# Patient Record
Sex: Female | Born: 1975 | Race: White | Hispanic: No | Marital: Married | Smoking: Former smoker
Health system: Southern US, Community
[De-identification: ages and names within clinical notes are randomized; demographics above are authoritative.]

## PROBLEM LIST (undated history)

## (undated) DIAGNOSIS — R0789 Other chest pain: Secondary | ICD-10-CM

## (undated) DIAGNOSIS — K219 Gastro-esophageal reflux disease without esophagitis: Secondary | ICD-10-CM

## (undated) DIAGNOSIS — F419 Anxiety disorder, unspecified: Secondary | ICD-10-CM

## (undated) DIAGNOSIS — R51 Headache: Secondary | ICD-10-CM

## (undated) DIAGNOSIS — M2020 Hallux rigidus, unspecified foot: Secondary | ICD-10-CM

## (undated) DIAGNOSIS — T7840XA Allergy, unspecified, initial encounter: Secondary | ICD-10-CM

## (undated) DIAGNOSIS — F410 Panic disorder [episodic paroxysmal anxiety] without agoraphobia: Secondary | ICD-10-CM

## (undated) DIAGNOSIS — J45909 Unspecified asthma, uncomplicated: Secondary | ICD-10-CM

## (undated) DIAGNOSIS — C801 Malignant (primary) neoplasm, unspecified: Secondary | ICD-10-CM

## (undated) DIAGNOSIS — F329 Major depressive disorder, single episode, unspecified: Secondary | ICD-10-CM

## (undated) DIAGNOSIS — I340 Nonrheumatic mitral (valve) insufficiency: Secondary | ICD-10-CM

## (undated) DIAGNOSIS — I493 Ventricular premature depolarization: Secondary | ICD-10-CM

## (undated) DIAGNOSIS — F32A Depression, unspecified: Secondary | ICD-10-CM

## (undated) DIAGNOSIS — I071 Rheumatic tricuspid insufficiency: Secondary | ICD-10-CM

## (undated) DIAGNOSIS — E2749 Other adrenocortical insufficiency: Secondary | ICD-10-CM

## (undated) DIAGNOSIS — Z8679 Personal history of other diseases of the circulatory system: Secondary | ICD-10-CM

## (undated) DIAGNOSIS — E2740 Unspecified adrenocortical insufficiency: Secondary | ICD-10-CM

## (undated) DIAGNOSIS — R002 Palpitations: Secondary | ICD-10-CM

## (undated) DIAGNOSIS — A630 Anogenital (venereal) warts: Secondary | ICD-10-CM

## (undated) HISTORY — DX: Unspecified asthma, uncomplicated: J45.909

## (undated) HISTORY — DX: Panic disorder (episodic paroxysmal anxiety): F41.0

## (undated) HISTORY — DX: Rheumatic tricuspid insufficiency: I07.1

## (undated) HISTORY — PX: FINGER SURGERY: SHX640

## (undated) HISTORY — DX: Personal history of other diseases of the circulatory system: Z86.79

## (undated) HISTORY — DX: Other chest pain: R07.89

## (undated) HISTORY — DX: Headache: R51

## (undated) HISTORY — DX: Hallux rigidus, unspecified foot: M20.20

## (undated) HISTORY — DX: Palpitations: R00.2

## (undated) HISTORY — DX: Anogenital (venereal) warts: A63.0

## (undated) HISTORY — PX: ESSURE TUBAL LIGATION: SUR464

## (undated) HISTORY — DX: Ventricular premature depolarization: I49.3

## (undated) HISTORY — DX: Malignant (primary) neoplasm, unspecified: C80.1

## (undated) HISTORY — DX: Nonrheumatic mitral (valve) insufficiency: I34.0

## (undated) HISTORY — DX: Allergy, unspecified, initial encounter: T78.40XA

## (undated) HISTORY — DX: Gastro-esophageal reflux disease without esophagitis: K21.9

## (undated) HISTORY — DX: Unspecified adrenocortical insufficiency: E27.40

---

## 2003-02-19 ENCOUNTER — Emergency Department (HOSPITAL_COMMUNITY): Admission: EM | Admit: 2003-02-19 | Discharge: 2003-02-19 | Payer: Self-pay | Admitting: Emergency Medicine

## 2003-02-19 ENCOUNTER — Encounter: Payer: Self-pay | Admitting: Emergency Medicine

## 2003-05-10 ENCOUNTER — Other Ambulatory Visit: Admission: RE | Admit: 2003-05-10 | Discharge: 2003-05-10 | Payer: Self-pay | Admitting: Family Medicine

## 2003-12-21 ENCOUNTER — Other Ambulatory Visit: Admission: RE | Admit: 2003-12-21 | Discharge: 2003-12-21 | Payer: Self-pay | Admitting: Obstetrics and Gynecology

## 2004-02-12 ENCOUNTER — Inpatient Hospital Stay (HOSPITAL_COMMUNITY): Admission: AD | Admit: 2004-02-12 | Discharge: 2004-02-12 | Payer: Self-pay | Admitting: Obstetrics and Gynecology

## 2004-03-10 ENCOUNTER — Inpatient Hospital Stay (HOSPITAL_COMMUNITY): Admission: AD | Admit: 2004-03-10 | Discharge: 2004-03-11 | Payer: Self-pay | Admitting: Obstetrics and Gynecology

## 2004-05-21 ENCOUNTER — Inpatient Hospital Stay (HOSPITAL_COMMUNITY): Admission: AD | Admit: 2004-05-21 | Discharge: 2004-05-21 | Payer: Self-pay | Admitting: Obstetrics and Gynecology

## 2004-06-05 ENCOUNTER — Inpatient Hospital Stay (HOSPITAL_COMMUNITY): Admission: AD | Admit: 2004-06-05 | Discharge: 2004-06-05 | Payer: Self-pay | Admitting: Obstetrics and Gynecology

## 2004-06-15 ENCOUNTER — Inpatient Hospital Stay (HOSPITAL_COMMUNITY): Admission: AD | Admit: 2004-06-15 | Discharge: 2004-06-24 | Payer: Self-pay | Admitting: Obstetrics & Gynecology

## 2004-06-17 ENCOUNTER — Inpatient Hospital Stay (HOSPITAL_COMMUNITY): Admission: AD | Admit: 2004-06-17 | Discharge: 2004-06-24 | Payer: Self-pay | Admitting: Obstetrics and Gynecology

## 2004-06-18 ENCOUNTER — Encounter (INDEPENDENT_AMBULATORY_CARE_PROVIDER_SITE_OTHER): Payer: Self-pay | Admitting: *Deleted

## 2004-06-25 ENCOUNTER — Encounter: Admission: RE | Admit: 2004-06-25 | Discharge: 2004-07-25 | Payer: Self-pay | Admitting: Obstetrics and Gynecology

## 2004-06-27 ENCOUNTER — Inpatient Hospital Stay (HOSPITAL_COMMUNITY): Admission: AD | Admit: 2004-06-27 | Discharge: 2004-07-01 | Payer: Self-pay | Admitting: Obstetrics and Gynecology

## 2004-06-28 ENCOUNTER — Encounter: Payer: Self-pay | Admitting: Obstetrics and Gynecology

## 2004-07-30 ENCOUNTER — Other Ambulatory Visit: Admission: RE | Admit: 2004-07-30 | Discharge: 2004-07-30 | Payer: Self-pay | Admitting: Obstetrics and Gynecology

## 2004-08-25 ENCOUNTER — Encounter: Admission: RE | Admit: 2004-08-25 | Discharge: 2004-09-24 | Payer: Self-pay | Admitting: Obstetrics and Gynecology

## 2004-09-16 ENCOUNTER — Ambulatory Visit: Payer: Self-pay | Admitting: Internal Medicine

## 2004-09-20 ENCOUNTER — Ambulatory Visit (HOSPITAL_COMMUNITY): Admission: RE | Admit: 2004-09-20 | Discharge: 2004-09-20 | Payer: Self-pay | Admitting: Internal Medicine

## 2004-10-14 ENCOUNTER — Encounter: Admission: RE | Admit: 2004-10-14 | Discharge: 2004-11-13 | Payer: Self-pay | Admitting: Obstetrics and Gynecology

## 2004-11-25 ENCOUNTER — Encounter: Admission: RE | Admit: 2004-11-25 | Discharge: 2004-12-25 | Payer: Self-pay | Admitting: Obstetrics and Gynecology

## 2005-01-23 ENCOUNTER — Encounter: Admission: RE | Admit: 2005-01-23 | Discharge: 2005-02-22 | Payer: Self-pay | Admitting: Obstetrics and Gynecology

## 2005-01-29 ENCOUNTER — Encounter: Admission: RE | Admit: 2005-01-29 | Discharge: 2005-01-29 | Payer: Self-pay | Admitting: Surgery

## 2005-03-25 ENCOUNTER — Encounter: Admission: RE | Admit: 2005-03-25 | Discharge: 2005-04-24 | Payer: Self-pay | Admitting: Obstetrics and Gynecology

## 2005-04-29 ENCOUNTER — Ambulatory Visit: Payer: Self-pay | Admitting: Internal Medicine

## 2005-05-25 ENCOUNTER — Encounter: Admission: RE | Admit: 2005-05-25 | Discharge: 2005-06-24 | Payer: Self-pay | Admitting: Obstetrics and Gynecology

## 2005-06-25 ENCOUNTER — Encounter: Admission: RE | Admit: 2005-06-25 | Discharge: 2005-07-24 | Payer: Self-pay | Admitting: Obstetrics and Gynecology

## 2005-07-25 ENCOUNTER — Encounter: Admission: RE | Admit: 2005-07-25 | Discharge: 2005-08-15 | Payer: Self-pay | Admitting: Obstetrics and Gynecology

## 2005-09-04 ENCOUNTER — Other Ambulatory Visit: Admission: RE | Admit: 2005-09-04 | Discharge: 2005-09-04 | Payer: Self-pay | Admitting: Obstetrics and Gynecology

## 2005-10-14 ENCOUNTER — Ambulatory Visit: Payer: Self-pay | Admitting: Internal Medicine

## 2005-10-17 ENCOUNTER — Ambulatory Visit: Payer: Self-pay | Admitting: Internal Medicine

## 2006-06-25 ENCOUNTER — Ambulatory Visit: Payer: Self-pay | Admitting: Internal Medicine

## 2006-06-26 ENCOUNTER — Ambulatory Visit: Payer: Self-pay | Admitting: Internal Medicine

## 2006-11-25 ENCOUNTER — Ambulatory Visit: Payer: Self-pay | Admitting: Internal Medicine

## 2006-12-30 ENCOUNTER — Ambulatory Visit: Payer: Self-pay | Admitting: Internal Medicine

## 2007-02-02 ENCOUNTER — Ambulatory Visit: Payer: Self-pay | Admitting: Internal Medicine

## 2007-02-23 ENCOUNTER — Ambulatory Visit: Payer: Self-pay | Admitting: Cardiology

## 2007-02-26 ENCOUNTER — Ambulatory Visit: Payer: Self-pay

## 2007-02-26 ENCOUNTER — Ambulatory Visit: Payer: Self-pay | Admitting: Cardiology

## 2007-04-02 ENCOUNTER — Ambulatory Visit: Payer: Self-pay | Admitting: Cardiology

## 2007-10-01 ENCOUNTER — Telehealth: Payer: Self-pay | Admitting: Internal Medicine

## 2007-10-01 DIAGNOSIS — F3112 Bipolar disorder, current episode manic without psychotic features, moderate: Secondary | ICD-10-CM | POA: Insufficient documentation

## 2007-10-15 ENCOUNTER — Encounter: Payer: Self-pay | Admitting: *Deleted

## 2007-10-15 DIAGNOSIS — R51 Headache: Secondary | ICD-10-CM | POA: Insufficient documentation

## 2007-10-15 DIAGNOSIS — J42 Unspecified chronic bronchitis: Secondary | ICD-10-CM | POA: Insufficient documentation

## 2007-10-15 DIAGNOSIS — J45909 Unspecified asthma, uncomplicated: Secondary | ICD-10-CM | POA: Insufficient documentation

## 2007-10-15 DIAGNOSIS — R519 Headache, unspecified: Secondary | ICD-10-CM | POA: Insufficient documentation

## 2007-10-15 DIAGNOSIS — Z9189 Other specified personal risk factors, not elsewhere classified: Secondary | ICD-10-CM | POA: Insufficient documentation

## 2007-10-15 DIAGNOSIS — T7840XA Allergy, unspecified, initial encounter: Secondary | ICD-10-CM | POA: Insufficient documentation

## 2007-10-15 DIAGNOSIS — Z8679 Personal history of other diseases of the circulatory system: Secondary | ICD-10-CM | POA: Insufficient documentation

## 2007-10-15 DIAGNOSIS — C801 Malignant (primary) neoplasm, unspecified: Secondary | ICD-10-CM | POA: Insufficient documentation

## 2007-10-15 DIAGNOSIS — K219 Gastro-esophageal reflux disease without esophagitis: Secondary | ICD-10-CM | POA: Insufficient documentation

## 2007-10-15 DIAGNOSIS — A630 Anogenital (venereal) warts: Secondary | ICD-10-CM | POA: Insufficient documentation

## 2008-02-15 ENCOUNTER — Ambulatory Visit: Payer: Self-pay | Admitting: Internal Medicine

## 2008-04-04 ENCOUNTER — Telehealth: Payer: Self-pay | Admitting: Internal Medicine

## 2008-08-08 ENCOUNTER — Telehealth: Payer: Self-pay | Admitting: Internal Medicine

## 2008-09-06 ENCOUNTER — Telehealth: Payer: Self-pay | Admitting: Internal Medicine

## 2008-09-15 ENCOUNTER — Encounter: Payer: Self-pay | Admitting: Internal Medicine

## 2008-11-07 ENCOUNTER — Telehealth: Payer: Self-pay | Admitting: Internal Medicine

## 2009-03-06 ENCOUNTER — Ambulatory Visit: Payer: Self-pay | Admitting: Internal Medicine

## 2009-03-07 ENCOUNTER — Telehealth: Payer: Self-pay | Admitting: Internal Medicine

## 2009-03-16 LAB — CONVERTED CEMR LAB
ALT: 30 units/L (ref 0–35)
AST: 32 units/L (ref 0–37)
Albumin: 4.5 g/dL (ref 3.5–5.2)
Alkaline Phosphatase: 42 units/L (ref 39–117)
BUN: 10 mg/dL (ref 6–23)
Basophils Absolute: 0 10*3/uL (ref 0.0–0.1)
Basophils Relative: 0.6 % (ref 0.0–3.0)
Bilirubin, Direct: 0.2 mg/dL (ref 0.0–0.3)
CO2: 30 meq/L (ref 19–32)
Calcium: 9.3 mg/dL (ref 8.4–10.5)
Chloride: 104 meq/L (ref 96–112)
Cholesterol: 200 mg/dL (ref 0–200)
Creatinine, Ser: 1 mg/dL (ref 0.4–1.2)
Eosinophils Absolute: 0.1 10*3/uL (ref 0.0–0.7)
Eosinophils Relative: 1 % (ref 0.0–5.0)
GFR calc non Af Amer: 67.71 mL/min (ref >60)
Glucose, Bld: 93 mg/dL (ref 70–99)
HCT: 41.8 % (ref 36.0–46.0)
HDL: 58 mg/dL (ref >39.00)
Hemoglobin: 14.6 g/dL (ref 12.0–15.0)
LDL Cholesterol: 127 mg/dL — ABNORMAL HIGH (ref 0–99)
Lymphocytes Relative: 34.9 % (ref 12.0–46.0)
Lymphs Abs: 1.8 10*3/uL (ref 0.7–4.0)
MCHC: 34.8 g/dL (ref 30.0–36.0)
MCV: 95.2 fL (ref 78.0–100.0)
Monocytes Absolute: 0.5 10*3/uL (ref 0.1–1.0)
Monocytes Relative: 9.8 % (ref 3.0–12.0)
Neutro Abs: 2.9 10*3/uL (ref 1.4–7.7)
Neutrophils Relative %: 53.7 % (ref 43.0–77.0)
Platelets: 258 10*3/uL (ref 150.0–400.0)
Potassium: 4.4 meq/L (ref 3.5–5.1)
RBC: 4.39 M/uL (ref 3.87–5.11)
RDW: 11.9 % (ref 11.5–14.6)
Sodium: 140 meq/L (ref 135–145)
TSH: 1.15 microintl units/mL (ref 0.35–5.50)
Total Bilirubin: 1.2 mg/dL (ref 0.3–1.2)
Total CHOL/HDL Ratio: 3
Total Protein: 7.2 g/dL (ref 6.0–8.3)
Triglycerides: 75 mg/dL (ref 0.0–149.0)
VLDL: 15 mg/dL (ref 0.0–40.0)
WBC: 5.3 10*3/uL (ref 4.5–10.5)

## 2009-03-24 ENCOUNTER — Encounter: Payer: Self-pay | Admitting: Internal Medicine

## 2009-04-23 ENCOUNTER — Ambulatory Visit: Payer: Self-pay | Admitting: Sports Medicine

## 2009-04-23 DIAGNOSIS — M2020 Hallux rigidus, unspecified foot: Secondary | ICD-10-CM | POA: Insufficient documentation

## 2009-04-23 DIAGNOSIS — M79609 Pain in unspecified limb: Secondary | ICD-10-CM | POA: Insufficient documentation

## 2009-05-07 ENCOUNTER — Telehealth (INDEPENDENT_AMBULATORY_CARE_PROVIDER_SITE_OTHER): Payer: Self-pay | Admitting: *Deleted

## 2009-06-27 DIAGNOSIS — R609 Edema, unspecified: Secondary | ICD-10-CM | POA: Insufficient documentation

## 2009-06-29 ENCOUNTER — Ambulatory Visit: Payer: Self-pay | Admitting: Cardiology

## 2009-06-29 DIAGNOSIS — R42 Dizziness and giddiness: Secondary | ICD-10-CM | POA: Insufficient documentation

## 2009-07-18 ENCOUNTER — Ambulatory Visit: Payer: Self-pay | Admitting: Sports Medicine

## 2009-08-20 ENCOUNTER — Encounter: Payer: Self-pay | Admitting: Cardiology

## 2009-08-24 ENCOUNTER — Ambulatory Visit (HOSPITAL_COMMUNITY): Admission: RE | Admit: 2009-08-24 | Discharge: 2009-08-24 | Payer: Self-pay | Admitting: Obstetrics and Gynecology

## 2009-09-12 ENCOUNTER — Telehealth: Payer: Self-pay | Admitting: Internal Medicine

## 2009-09-14 ENCOUNTER — Telehealth: Payer: Self-pay | Admitting: Internal Medicine

## 2010-03-26 ENCOUNTER — Ambulatory Visit: Payer: Self-pay | Admitting: Internal Medicine

## 2010-04-29 ENCOUNTER — Telehealth: Payer: Self-pay | Admitting: Internal Medicine

## 2010-05-21 ENCOUNTER — Telehealth: Payer: Self-pay | Admitting: Internal Medicine

## 2010-10-10 ENCOUNTER — Telehealth: Payer: Self-pay | Admitting: Internal Medicine

## 2010-10-27 ENCOUNTER — Encounter: Payer: Self-pay | Admitting: Obstetrics and Gynecology

## 2010-11-07 NOTE — Progress Notes (Signed)
Summary: Albuterol clarification  Phone Note From Pharmacy   Summary of Call: Pharmacy received prescription for albuterol solution. Patient states that it was supposed to be for inhaler. Please advise. Initial call taken by: Lucious Groves CMA,  April 29, 2010 10:04 AM  Follow-up for Phone Call        coorection: should be inhaler, e.g. ProAir 2 puffs qid as needed  Follow-up by: Jacques Navy MD,  April 29, 2010 10:49 AM  Additional Follow-up for Phone Call Additional follow up Details #1::        Left message on voicemail to call back to office. Lucious Groves CMA  April 29, 2010 11:08 AM   Pt advised via personal VM that inhaler was [facility] to [facility]. Additional Follow-up by: Margaret Pyle, CMA,  April 29, 2010 11:47 AM    New/Updated Medications: PROAIR HFA 108 (90 BASE) MCG/ACT AERS (ALBUTEROL SULFATE) 2 puffs qid Prescriptions: PROAIR HFA 108 (90 BASE) MCG/ACT AERS (ALBUTEROL SULFATE) 2 puffs qid  #1 month x 3   Entered by:   Lucious Groves CMA   Authorized by:   Jacques Navy MD   Signed by:   Lucious Groves CMA on 04/29/2010   Method used:   [facility]ronically [facility]        [facility] [address] [phone]* (retail)       [address]       [place], [place]  [address]       Ph: [phone] or [phone]       Fax: [phone]   RxID:   [id number]

## 2010-11-07 NOTE — Progress Notes (Signed)
Summary: REFILL Kathleen Sullivan  Phone Note Refill Request Message from:  Pharmacy  Refills Requested: Medication #1:  ALPRAZOLAM 1 MG  TABS 1 or 2 at bedtime for panic disorder [facility] [phone]  Initial call taken by: Lamar Sprinkles, CMA,  October 10, 2010 5:25 PM  Follow-up for Phone Call        ok for refill x 5  Follow-up by: Jacques Navy MD,  October 11, 2010 10:24 AM    Prescriptions: ALPRAZOLAM 1 MG  TABS (ALPRAZOLAM) 1 or 2 at bedtime for panic disorder  #60 x 4   Entered and Authorized by:   Margaret Pyle, CMA   Signed by:   Margaret Pyle, CMA on 10/11/2010   Method used:   Telephoned to ...       [facility]. [phone]* (retail)       [address]       [place], [place]  [address]       Ph: [phone]       Fax: [phone]   RxID:   [id number]

## 2010-11-07 NOTE — Letter (Signed)
Summary: [employer] [facility] [employer] [facility]   Imported By: Kassie Mends 10/23/2009 09:29:39  _____________________________________________________________________  External Attachment:    Type:   Image     Comment:   External Document

## 2010-11-07 NOTE — Assessment & Plan Note (Signed)
Summary: F/U APPT PER PT/#/CD   Vital Signs:  Patient profile:   35 year old female Height:      62 inches Weight:      182 pounds BMI:     33.41 O2 Sat:      97 % on Room air Temp:     97.2 degrees F oral Pulse rate:   92 / minute BP sitting:   116 / 84  (left arm) Cuff size:   regular  Vitals Entered By: Bill Salinas CMA (March 26, 2010 2:08 PM)  O2 Flow:  Room air CC: follow-up visit, pt needs refill on alprazolam,wellbutrin and valtrex/ ab   Primary Care Provider:  Verlin Uher  CC:  follow-up visit, pt needs refill on alprazolam, and wellbutrin and valtrex/ ab.  History of Present Illness: Patient presents for annual medical foolow up. She is current with her gynecologist and had ESSURE procedure for birth control-permanent. Normal pelvic and pap smear. She has been feeling well. Her toe has gotten better - after seeing Dr. Darrick Penna - full ROM. She is continuing to suffer with allergies.   Current Medications (verified): 1)  Valtrex 1 Gm Tabs (Valacyclovir Hcl) .... Take 1 Tablet By Mouth Once A Day 2)  Alprazolam 1 Mg  Tabs (Alprazolam) .Marland Kitchen.. 1 or 2 At Bedtime For Panic Disorder 3)  Neurontin 300 Mg  Caps (Gabapentin) .... Take One Capsule Three Times A Day 4)  Allegra-D 12 Hour 60-120 Mg  Tb12 (Fexofenadine-Pseudoephedrine) .... Take One Tablet Once Daily 5)  Zantac 150 Mg  Tabs (Ranitidine Hcl) .... Take 2 Tablets Daily 6)  Wellbutrin Xl 300 Mg  Tb24 (Bupropion Hcl) .... Take One Tablet Once Daily 7)  Nasonex 50 Mcg/act  Susp (Mometasone Furoate) .... 2 Sprays Daily 8)  Astelin 137 Mcg/spray  Soln (Azelastine Hcl) .... 2 Sprays Daily 9)  Nexium 40 Mg Cpdr (Esomeprazole Magnesium) .Marland Kitchen.. 1 Tab By Mouth Once Daily 10)  Asthmanex .Marland KitchenMarland Kitchen. 1 Puff By Mouth Daily 11)  Albuterol Sulfate (2.5 Mg/44ml) 0.083% Nebu (Albuterol Sulfate) .... As Needed 12)  Singulair 10 Mg Tabs (Montelukast Sodium) .Marland Kitchen.. 1 Tablet By Mouth At Bedtime 13)  Voltaren 1 % Gel (Diclofenac Sodium) .... 1/2 Gram To Each Mtp  Joint 4 Times A Day Dispense: 100 G 14)  Pataday 0.2 % Soln (Olopatadine Hcl) .... As Directed 15)  Azasite 1 % Soln (Azithromycin) .... As Directed  Allergies (verified): 1)  ! 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 father - deceased @27 : MVA mother -  'deceased 03/02/48: anxiety, breast cancer survivor, uterine cancer survivor 2nd degree kin - breast cancer, CAD, CVA, DM, HTN, OA  Social History: Reviewed history from 06/29/2009 and no changes required. 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    Physical Exam  General:  overweight white female in no distress Head:  Normocephalic and atraumatic without obvious abnormalities. No apparent  alopecia or balding. Eyes:  mild exopthalmos, C&S clear, PERRLA, Fundi normal Ears:  External ear exam shows no significant lesions or deformities.  Otoscopic examination reveals clear canals, tympanic membranes are intact bilaterally without bulging, retraction, inflammation or discharge. Hearing is grossly normal bilaterally. Nose:  no external deformity and no external erythema.   Mouth:  Oral mucosa and oropharynx without lesions or exudates.  Teeth in good repair. Neck:  supple, no thyromegaly, and no carotid bruits.   Chest Wall:  no deformities and no tenderness.   Lungs:  Normal respiratory effort, chest expands symmetrically. Lungs are clear to auscultation, no crackles or wheezes. Heart:  Normal rate and regular rhythm. S1 and S2 normal without gallop, murmur, click, rub or other extra sounds. Abdomen:  soft, non-tender, normal bowel sounds, no guarding, no rigidity, and no hepatomegaly.   Genitalia:  deferred Msk:  normal ROM, no joint tenderness, no joint swelling, and no joint instability.   Pulses:  2+ radial and DP pulses Extremities:  No clubbing, cyanosis, edema, or deformity noted with normal full range of motion of all joints.   Neurologic:  alert & oriented X3, cranial nerves II-XII intact, strength normal in all extremities, sensation intact to light touch, gait normal, and DTRs symmetrical and normal.   Skin:  turgor normal, color normal, no rashes, no purpura, and no ulcerations.   Cervical Nodes:  no anterior cervical adenopathy and no posterior cervical adenopathy.   Psych:  Oriented X3, memory intact for recent and remote, normally interactive, and good eye contact.  A bit hyper.   Impression & Recommendations:  Problem # 1:  PALPITATIONS, HX OF (ICD-V12.50) Still with occasional palpation but not interfering with activities or quality of life.  Problem # 2:  GASTROESOPHAGEAL REFLUX DISEASE (ICD-530.81) Doing well on her present regimen  Plan - no further eval -  continue present medications  Her updated medication list for this problem includes:    Zantac 150 Mg Tabs (Ranitidine hcl) .Marland Kitchen... Take 2 tablets daily    Nexium 40 Mg Cpdr (Esomeprazole magnesium) .Marland Kitchen... 1 tab by mouth once daily  Problem # 3:  ASTHMA (ICD-493.90) No respiratory distress on exam and she reports that her symptoms have generally been well controlled.  Plan - continue present regimen  Her updated medication list for this problem includes:    Albuterol Sulfate (2.5 Mg/56ml) 0.083% Nebu (Albuterol sulfate) .Marland Kitchen... As needed    Singulair 10 Mg Tabs (Montelukast sodium) .Marland Kitchen... 1 tablet by mouth at bedtime  Problem # 4:  PANIC DISORDER WITHOUT AGORAPHOBIA (ICD-300.01) Stable on her present medical regimen with no recent panic attacks. She has no medication related problems or side effects.  Her updated medication list for this problem includes:    Alprazolam 1 Mg Tabs (Alprazolam) .Marland Kitchen... 1 or 2 at bedtime for panic disorder    Wellbutrin Xl 300 Mg Tb24 (Bupropion hcl) .Marland Kitchen... Take one tablet once daily  Problem # 5:  Preventive Health Care (ICD-V70.0) Normal limited exam. She is current with her gynecologist. No need for labs today.  In summary - a  nice woman who appears medically stable. She will be resuming her exercise program for fitness and weight loss. She will continue to follow with her opthal. All Rx's refilled. She will return as needed.  Complete Medication List: 1)  Valtrex 1 Gm Tabs (Valacyclovir hcl) .... Take 1 tablet by mouth once a day 2)  Alprazolam 1 Mg Tabs (Alprazolam) .Marland Kitchen.. 1 or 2 at bedtime for panic disorder 3)  Neurontin 300 Mg Caps (Gabapentin) .... Take one capsule three times a day 4)  Allegra-d 12 Hour 60-120 Mg Tb12 (Fexofenadine-pseudoephedrine) .... Take one tablet once daily 5)  Zantac 150 Mg Tabs (Ranitidine hcl) .... Take 2 tablets daily 6)  Wellbutrin Xl 300 Mg Tb24 (Bupropion hcl) .... Take one tablet once daily 7)  Fluticasone Propionate 50  Mcg/act Susp (Fluticasone propionate) .Marland Kitchen.. 1 spray per nares once daily 8)  Astelin 137 Mcg/spray Soln (Azelastine hcl) .... 2 sprays daily 9)  Nexium 40 Mg Cpdr (Esomeprazole magnesium) .Marland Kitchen.. 1 tab by mouth once daily 10)  Asthmanex  .Marland Kitchen.. 1 puff by mouth daily 11)  Albuterol Sulfate (2.5 Mg/43ml) 0.083% Nebu (Albuterol sulfate) .... As needed 12)  Singulair 10 Mg Tabs (Montelukast sodium) .Marland Kitchen.. 1 tablet by mouth at bedtime 13)  Voltaren 1 % Gel (Diclofenac sodium) .... 1/2 gram to each mtp joint 4 times a day dispense: 100 g 14)  Pataday 0.2 % Soln (Olopatadine hcl) .... As directed 15)  Azasite 1 % Soln (Azithromycin) .... As directed  Other Orders: TLB-Lipid Panel (80061-LIPID) TLB-BMP (Basic Metabolic Panel-BMET) (80048-METABOL) TLB-CBC Platelet - w/Differential (85025-CBCD) Prescriptions: VALTREX 1 GM TABS (VALACYCLOVIR HCL) Take 1 tablet by mouth once a day  #90 x 12   Entered and Authorized by:   Jacques Navy MD   Signed by:   Jacques Navy MD on 03/26/2010   Method used:   Print then Give to Patient   RxID:   [id number] ALPRAZOLAM 1 MG  TABS (ALPRAZOLAM) 1 or 2 at bedtime for panic disorder  #60 x 5   Entered and Authorized by:   Jacques Navy MD   Signed by:   Jacques Navy MD on 03/26/2010   Method used:   Print then Give to Patient   RxID:   [id number] SINGULAIR 10 MG TABS (MONTELUKAST SODIUM) 1 tablet by mouth at bedtime  #30 x 12   Entered and Authorized by:   Jacques Navy MD   Signed by:   Jacques Navy MD on 03/26/2010   Method used:   Print then Give to Patient   RxID:   [id number] ALBUTEROL SULFATE (2.5 MG/3ML) 0.083% NEBU (ALBUTEROL SULFATE) as needed  #1 x 12   Entered and Authorized by:   Jacques Navy MD   Signed by:   Jacques Navy MD on 03/26/2010   Method used:   Print then Give to Patient   RxID:   [id number] ASTHMANEX 1 puff by mouth daily  #1 x 12   Entered and Authorized by:   Jacques Navy MD   Signed  by:   Jacques Navy MD on 03/26/2010   Method used:   Print then Give to Patient   RxID:   [id number] FLUTICASONE PROPIONATE 50 MCG/ACT SUSP (FLUTICASONE PROPIONATE) 1 spray per nares once daily  #1 x 12   Entered and Authorized by:   Jacques Navy MD   Signed by:   Jacques Navy MD on 03/26/2010   Method used:  Print then Give to Patient   RxID:   [id number] WELLBUTRIN XL 300 MG  TB24 (BUPROPION HCL) Take one tablet once daily  #90 x 3   Entered and Authorized by:   Jacques Navy MD   Signed by:   Jacques Navy MD on 03/26/2010   Method used:   Print then Give to Patient   RxID:   [id number]

## 2010-11-07 NOTE — Progress Notes (Signed)
Summary: Welbutrin  Phone Note Call from Patient Call back at [facility] Phone [phone]   Summary of Call: Per pt it will be cheaper to take welbutrin SR 1 two times a day. Is change ok?  Initial call taken by: Lamar Sprinkles, CMA,  May 21, 2010 4:13 PM  Follow-up for Phone Call        that is the 150mg  dose and it is ok to change (please note in med list). She will need to watch for sleep disturbance when taking the PM dose. Follow-up by: Jacques Navy MD,  May 22, 2010 5:02 AM  Additional Follow-up for Phone Call Additional follow up Details #1::        left mess to call office back................Marland KitchenLamar Sprinkles, CMA  May 22, 2010 9:24 AM   Pt informed  Additional Follow-up by: Lamar Sprinkles, CMA,  May 22, 2010 2:20 PM    New/Updated Medications: WELLBUTRIN SR 150 MG XR12H-TAB (BUPROPION HCL) 1 two times a day Prescriptions: WELLBUTRIN SR 150 MG XR12H-TAB (BUPROPION HCL) 1 two times a day  #180 x 3   Entered by:   Lamar Sprinkles, CMA   Authorized by:   Jacques Navy MD   Signed by:   Lamar Sprinkles, CMA on 05/22/2010   Method used:   Electronically to        [facility]. [phone]* (retail)       [address]       [place], [place]  [address]       Ph: [phone]       Fax: [phone]   RxID:   [id number] WELLBUTRIN SR 150 MG XR12H-TAB (BUPROPION HCL) 1 two times a day  #180 x 3   Entered by:   Lamar Sprinkles, CMA   Authorized by:   Jacques Navy MD   Signed by:   Lamar Sprinkles, CMA on 05/22/2010   Method used:   [facility]        [facility] [address] [phone]* (retail)       [address]       [place], [place]  [address]       Ph: [phone] or [phone]       Fax: [phone]   RxID:   [id number]

## 2011-01-04 ENCOUNTER — Emergency Department (HOSPITAL_COMMUNITY)
Admission: EM | Admit: 2011-01-04 | Discharge: 2011-01-04 | Disposition: A | Discharge status: Home / Self Care | Payer: Medicaid Other | Attending: Emergency Medicine | Admitting: Emergency Medicine

## 2011-01-04 DIAGNOSIS — R1310 Dysphagia, unspecified: Secondary | ICD-10-CM | POA: Insufficient documentation

## 2011-01-04 DIAGNOSIS — K219 Gastro-esophageal reflux disease without esophagitis: Secondary | ICD-10-CM | POA: Insufficient documentation

## 2011-01-04 DIAGNOSIS — I059 Rheumatic mitral valve disease, unspecified: Secondary | ICD-10-CM | POA: Insufficient documentation

## 2011-01-08 LAB — CBC
HCT: 44.4 % (ref 36.0–46.0)
Hemoglobin: 15.2 g/dL — ABNORMAL HIGH (ref 12.0–15.0)
MCHC: 34.2 g/dL (ref 30.0–36.0)
MCV: 96.6 fL (ref 78.0–100.0)
Platelets: 267 10*3/uL (ref 150–400)
RBC: 4.59 MIL/uL (ref 3.87–5.11)
RDW: 12.1 % (ref 11.5–15.5)
WBC: 7.9 10*3/uL (ref 4.0–10.5)

## 2011-01-08 LAB — COMPREHENSIVE METABOLIC PANEL
ALT: 18 U/L (ref 0–35)
AST: 19 U/L (ref 0–37)
Albumin: 4.4 g/dL (ref 3.5–5.2)
Alkaline Phosphatase: 38 U/L — ABNORMAL LOW (ref 39–117)
BUN: 5 mg/dL — ABNORMAL LOW (ref 6–23)
CO2: 30 mEq/L (ref 19–32)
Calcium: 9.4 mg/dL (ref 8.4–10.5)
Chloride: 102 mEq/L (ref 96–112)
Creatinine, Ser: 0.87 mg/dL (ref 0.4–1.2)
GFR calc Af Amer: >60 mL/min (ref >60)
GFR calc non Af Amer: >60 mL/min (ref >60)
Glucose, Bld: 90 mg/dL (ref 70–99)
Potassium: 3.7 mEq/L (ref 3.5–5.1)
Sodium: 139 mEq/L (ref 135–145)
Total Bilirubin: 0.6 mg/dL (ref 0.3–1.2)
Total Protein: 7.2 g/dL (ref 6.0–8.3)

## 2011-01-08 LAB — HCG, SERUM, QUALITATIVE: Preg, Serum: NEGATIVE

## 2011-01-27 ENCOUNTER — Telehealth: Payer: Self-pay | Admitting: *Deleted

## 2011-01-27 MED ORDER — BUPROPION HCL ER (SR) 150 MG PO TB12: 150.0000 mg | ORAL_TABLET | Freq: Two times a day (BID) | ORAL | Status: DC

## 2011-01-27 NOTE — Telephone Encounter (Signed)
refill 

## 2011-02-13 ENCOUNTER — Ambulatory Visit: Payer: Medicaid Other | Admitting: Internal Medicine

## 2011-02-18 NOTE — Assessment & Plan Note (Signed)
[facility]                            CARDIOLOGY OFFICE NOTE   NAME:Kathleen Sullivan, NIMRA PUCCINELLI                  MRN:          [id number]  DATE:02/23/2007                            DOB:          03-05-1976    REASON FOR CONSULTATION:  Chest pain, dizziness, and palpitations.   The patient is a 35 year old female with a past medical history of  asthma, gastroesophageal reflux disease, and panic disorder, whom we  were asked to evaluate for the above symptoms.  She has no prior cardiac  history.  Note, she exercises 5 times per week.  She does 45 minutes of  cardiovascular training, as well as 45 minutes to 1 hour of weight  lifting.  She typically does not have dyspnea on exertion, orthopnea,  PND, or pedal edema.  She does not have exertional chest pain.  Over the  past 1 month, she has had pain in the left breast area.  It is described  as tightness and a sharp pain.  It does not radiate.  There is no  associated shortness of breath, nausea or vomiting, or diaphoresis.  It  is not pleuritic or positional, nor is it related to food.  It does not  occur or worsen with exertion.  She also has an occasional pause in  her heartbeat as well as a skip.  There is no associated pre-syncope or  syncope.  She also has had problems with dizziness.  She states that  this occurs when she goes from the lying to the standing position, or  from the sitting to the standing position.  She does not have frank  syncope.  She has no other dizziness at any other time.  Because of the  above, we were asked to further evaluate.   MEDICATIONS:  1. Allegra D.  2. Xyzal 5 mg p.o. daily.  3. Xanax 1 to 2 p.o. daily.  4. Zantac 300 mg p.o. daily.  5. Neurontin 300 mg p.o. t.i.d.  6. Valtrex 300 mg p.o. daily.  7. Wellbutrin XL 300 mg p.o. daily.  8. Hydrocodone 1 to 2 daily.  9. Nasonex.  10.Astelin.  11.MiraLax.  12.Allergy shots.  13.She also takes Ambien as needed.   She has an allergy to [facility].   SOCIAL HISTORY:  She has smoked in the past, but has not since 2004.  She rarely consumes alcohol.  She denies any drug use.   FAMILY HISTORY:  Significant for vasovagal syncope in her aunts.  There  is no premature cardiac disease in her immediate family.   PAST MEDICAL HISTORY:  There is no diabetes mellitus, hypertension, or  hyperlipidemia.  She does have problems with allergies and asthma.  She  has a history of gastroesophageal reflux disease.  She also apparently  has panic disorder and has had a previous skin cancer removed.  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  She has had a previous C-  section.  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  There is no other past medical history noted.  She did have preeclampsia  with her children.   REVIEW OF SYSTEMS:  She denies any headaches, fevers, or chills.  There  is no productive cough or hemoptysis.  There is no dysphagia,  odynophagia, melena, or hematochezia.  There is no dysuria or hematuria.  There is no rash or seizure activity.  There is no orthopnea, PND, or  pedal edema.  The remaining systems are negative.   PHYSICAL EXAM:  In the lying position, her pulse is 61 and her blood  pressure is 139/85.  In the sitting position, her blood pressure is 126/  84, and her pulse is 70.  She did describe dizziness at that point.  In  the standing position, she was 61 pulse with a blood pressure of 127/84  at 0 minutes and at 5 minutes she was 72 and 120/78.  She weighs 123  pounds.  She is well-developed, well-nourished in no acute distress.  SKIN:  Warm and dry.  She is mildly anxious, but there is no evidence of depression.  There is no peripheral clubbing.  Her back is normal.  HEENT:  Normal.  NECK:  Supple with a normal upstroke bilaterally.  No bruits noted.  There is no jugular venous distension and I cannot appreciate  thyromegaly.  CHEST:  Clear to  auscultation with normal expansion.  CARDIOVASCULAR:  Regular rate and rhythm.  Normal S1, S2.  There are no  murmurs, rubs, or gallops noted.  ABDOMEN:  Nontender.  Nondistended.  Positive bowel sounds.  No  hepatosplenomegaly.  No mass appreciated.  No abdominal bruit.  She has  2+ femoral pulses bilaterally with no bruits.  EXTREMITIES:  No edema and I can palpate no cords.  She has 2+ posterior  tibial pulses bilaterally.  NEUROLOGIC:  Grossly intact.   Her electrocardiogram shows a sinus rhythm at a rate or 65.  There are  no significant ST changes.   DIAGNOSES:  1. Atypical chest pain - the etiology of this is not clear to me, but      I do not think this is cardiac.  It has been persistent      continuously for 1 month.  Her electrocardiogram is normal.  We      will not pursue further ischemic evaluation.  I will check an      echocardiogram to quantify her left ventricular function given her      palpitations, and also to exclude pericardial effusion, although I      think this is unlikely.  2. Palpitations - we will schedule her to have a CardioNet monitor to      more fully evaluate.  3. Dizziness - her symptoms sound to be orthostatic, although she is      not orthostatic in the office today.  I have encouraged increased      fluid intake and we will follow this expectantly.  4. Panic disorder - per Dr. Debby Bud.  5. Gastroesophageal reflux disease - per Dr. Debby Bud.   We will see her back in approximately 4 to 6 weeks to review the above  information.     Madolyn Frieze Jens Som, MD, [facility]  Electronically Signed    BSC/MedQ  DD: 02/23/2007  DT: 02/23/2007  Job #: [id number]   cc:   Rosalyn Gess. Norins, MD

## 2011-02-18 NOTE — Assessment & Plan Note (Signed)
[facility]                            CARDIOLOGY OFFICE NOTE   NAME:Ackman, ALLAN BACIGALUPI                  MRN:          [id number]  DATE:04/02/2007                            DOB:          10/28/1975    Mrs. Harewood is a 35 year old female who has asthma, panic disorder,  gastroesophageal reflux disease, who I recently saw for chest pain and  palpitations.  Note, her chest pain had been continuous for  approximately 1 month when I saw her and we did not think it was  ischemic pain.  We did perform an echocardiogram which showed normal LV  function.  There was mild mitral regurgitation.  There was mild left  atrial enlargement.  There was mild tricuspid regurgitation.  We also  performed an event monitor which showed sinus tachycardia, PACs, PVCs,  and the rare couplet.  Since then she states her chest pain has  resolved.  She does occasionally have mild dyspnea on exertion, but this  does not appear to be severely limiting.  She continues to have  palpitations that are described as a skip.  There has been no syncope.   MEDICATIONS:  1. Allegra D.  2. Xyzal 5 mg p.o. daily.  3. Xanax 1-2 daily.  4. Zantac.  5. Neurontin.  6. Valtrex.  7. Wellbutrin.  8. Hydrocodone.  9. Nasonex.  10.Astelin.  11.MiraLax.  12.Allergy shots.   PHYSICAL EXAMINATION:  Shows a blood pressure of 125/80 and her pulse is  76.  She is well developed and extremely anxious.  Her HEENT is normal.  Her neck is supple.  Her chest is clear.  Her cardiovascular exam reveals regular rate and rhythm.  Her abdominal exam is benign.  Extremities show no edema.   DIAGNOSES:  1. Palpitations secondary to PACs and PVCs:  We discussed the options      today including low-dose beta blocker.  However, she states her      heart rate runs low.  She does not wish to pursue this at this      point.  I explained that in the setting of normal left ventricular      function, PACs and  PVCs are benign, and she appears to understand      this.  We will check a BMET today to follow her potassium, as well      as a magnesium.  If these are normal, we will not pursue further      cardiac workup at this point.  2. Atypical chest pain:  Now resolved.  3. Mild orthostatic symptoms:  She will continue to increase fluid      intake.  4. Panic disorder and gastroesophageal reflux disease:  Per Dr.      Debby Bud.   I will see her back in 12 months.     Madolyn Frieze Jens Som, MD, [facility]  Electronically Signed    BSC/MedQ  DD: 04/02/2007  DT: 04/02/2007  Job #: [id number]

## 2011-02-18 NOTE — Assessment & Plan Note (Signed)
[facility]                                 ON-CALL NOTE   NAME:BONILLAFlynn, Lininger                    MRN:          [id number]  DATE:02/14/2007                            DOB:          07/10/1976    Patient of Dr. Debby Bud.   She called from [phone] stating she is having a problem with her right  arm with numbness and tingling from her biceps to her fingertips that  has been going on for a while now. She did see Dr. Debby Bud last week with  complaint of some chest pain and left-sided symptoms and apparently an  abnormality was seen on the EKG that was not that worrisome. The patient  was told to take Prilosec and to call if she had any problems. She was  having some more chest discomfort so she had emailed Dr. Debby Bud and he  told her he would refer her to a cardiologist. Now the patient is having  right arm symptoms as well with the chest pain and she did take one  Prilosec over-the-counter this morning. I did recommend the emergency  room but she was very reluctant to go. I told her she could try to take  a second Prilosec but that I really recommended she go to the emergency  room to be evaluated especially if her symptoms did not subside     Lelon Perla, DO  Electronically Signed    Shawnie Dapper  DD: 02/14/2007  DT: 02/15/2007  Job #: [id number]   cc:   Rosalyn Gess. Norins, MD

## 2011-02-21 NOTE — Assessment & Plan Note (Signed)
[facility]                         GASTROENTEROLOGY OFFICE NOTE   NAME:Kathleen Sullivan, Kathleen Sullivan                  MRN:          [id number]  DATE:11/25/2006                            DOB:          Oct 03, 1976    REASON FOR CONSULTATION:  Constipation.   HISTORY OF PRESENT ILLNESS:  This is a 35 year old white female with a  history of anxiety, panic disorder and skin cancer, who is referred  through the courtesy of Dr. Henderson Cloud regarding constipation.  The patient  reports regular bowel habits until approximately 3 months ago.  She  began to develop problems with constipation.  She reports trying fiber.  This seemed to work for a short period of time.  She subsequently stated  that when she overate unhealthy foods like pizza this seemed to help.  She did not feel this was a healthy strategy and began to use laxatives  intermittently with results.  More recently was placed on MiraLax, which  when she takes it daily results in daily bowel movements.  She is  concerned about being laxative-dependent.  Associated with her  constipation she has had some abdominal bloating and discomfort.  She  has also noted some minor rectal bleeding, mostly on the toilet tissue.  She also reports mucus in her stool.  When she does have a bowel habit  she notes a thinner caliber.  No nausea or vomiting.  The patient has  lost about 90 pounds in the past 2 years.  She attributes this to diet  and exercise after the birth of her child.  Her only relatively new  medication is calcium over the past 6 months.  In addition, started on  prednisone and Valtrex within the past few weeks.  She has had thyroid  testing in September.  This was normal.  She also underwent general  chemistries which were normal.  In particular normal calcium level.  Hemoglobin was normal, no evidence of anemia.   PAST MEDICAL HISTORY:  1. Anxiety with panic disorder.  2. Squamous cell skin cancer.  3.  Chronic headaches.  4. Sinus and allergy troubles.  5. Status post cesarean section in 2005.  6. Ring finger surgery.  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   ALLERGIES:  INTOLERANT TO REGLAN.   CURRENT MEDICATIONS:  1. Neurontin 300 mg t.i.d.  2. Xanax 1 mg at night.  3. Singulair 10 mg daily.  4. Allegra D b.i.d.  5. Prednisone 20 mg every other day, started 2 weeks ago.  6. Valtrex 500 mg daily, started recently.  7. MiraLax every other day.  8. Multivitamin daily.  9. Calcium daily.   FAMILY HISTORY:  No family history of gastrointestinal malignancy.   SOCIAL HISTORY:  The patient is married with one daughter.  She lives  with her spouse.  She attended college.  She is not working.  She does  not smoke, she does not use alcohol.   REVIEW OF SYSTEMS:  Per diagnostic evaluation form.   PHYSICAL EXAMINATION:  Well-appearing female in no acute distress.  Blood pressure is 126/78, heart rate is 88 and regular,  weight is 118.4  pounds.  She is 4 feet 11 inches in height.  HEENT:  Sclerae are anicteric, conjunctivae are pink, oral mucosa is  intact, no adenopathy.  LUNGS:  Clear.  HEART:  Regular.  ABDOMEN:  Soft without tenderness, mass or hernia.  Good bowel sounds  heard.  She has a small butterfly tattoo on the left lower back.  RECTAL:  Exam reveals no external abnormalities, no internal tenderness  or mass.  Stool soft, brown, Hemoccult-negative.  EXTREMITIES:  Are without edema.   IMPRESSION:  This is a 35 year old female who reports abrupt onset of  problems with constipation over the past 3 months.  She also reports  associated abdominal discomfort, bloating and intermittent minor rectal  bleeding as well as mucus per rectum.  Constipation may be functional  and due to medication such as calcium.  No other obvious metabolic cause  such as hypercalcemia or hypothyroidism.  Need to rule out structural  lesion, namely neoplasm, given  the presence of bleeding.   RECOMMENDATIONS:  1. Continue MiraLax for the time being.  2. Schedule colonoscopy.  The nature of the procedure as well as the      risks, benefits and alternatives were reviewed in great detail.      She understood and agreed to proceed.     Wilhemina Bonito. Marina Goodell, MD  Electronically Signed    JNP/MedQ  DD: 11/25/2006  DT: 11/26/2006  Job #: [id number]   cc:   Guy Sandifer. Henderson Cloud, M.D.  Rosalyn Gess Norins, MD

## 2011-02-21 NOTE — Discharge Summary (Signed)
Kathleen Sullivan, DOBY             ACCOUNT NO.:  [id number]   MEDICAL RECORD NO.:  [id number]          PATIENT TYPE:  INP   LOCATION:  9145                          FACILITY:  [facility]   PHYSICIAN:  Dineen Kid. Rana Snare, M.D.    DATE OF BIRTH:  [birth year]   DATE OF ADMISSION:  06/17/2004  DATE OF DISCHARGE:  06/24/2004                                 DISCHARGE SUMMARY   ADMISSION DIAGNOSES:  1.  Intrauterine pregnancy at 35-1/2 weeks estimated gestational age.  2.  Two-stage induction of labor secondary to oligohydramnios.   DISCHARGE DIAGNOSES:  1.  Status post low transverse cesarean section secondary to failure to      progress and arrest of dilatation.  2.  Viable female infant.   PROCEDURE:  Primary low transverse cesarean section.   REASON FOR ADMISSION:  Please see dictated H&P.   HOSPITAL COURSE:  The patient is a 35 year old primigravida who was admitted  to [facility] at 35-4/7 weeks estimated gestational age for  an induction of labor.  The patient had been seen in the office for her  regular visit and was noted to have a blood pressure of 128/100.  PIH labs  had been drawn previously and had been within normal limits.  The patient  had had a reactive non-stress test in the office; however, ultrasound had  shown amniotic fluid index to be 2.5.  Biophysical profile was 6/8.  The  fetus was in the vertex presentation.  The patient was now admitted for an  induction of labor.  Artificial rupture of membranes was performed with  clear fluid.  The cervix was noted to be 1 cm dilated, 50% effaced.  The  patient did progress to full dilatation.  She did push for approximately two  and a half hours without progression of the vertex beyond a +1 station.  There was some molding and caput that was noted.  Diagnosis of arrest of  descent was made, and decision was made to proceed with a cesarean delivery.  The patient was then transferred to the operating room where epidural  was  dosed to an adequate surgical level.  A low transverse incision was made,  with delivery of a viable female infant weighing 5 pounds 12 ounces, with  Apgars of 5 at one minute and 7 at five minutes.  Arterial cord pH was 7.13.  The patient tolerated the procedure well and was taken to the recovery room  in stable condition.   On postoperative day #1, the patient denied complaint.  She denied headache  or right upper quadrant pain.  Vital signs were stable, with blood pressure  134/95 to 119/78.  Deep tendon reflexes were 1+ without clonus.  Pedal edema  was noted.  The abdomen was soft, with good return of bowel function.  The  abdominal dressing was noted to be clean, dry, and intact.  The fundus was  firm and nontender.  The Foley was noted to have approximately 600 cc of  concentrated urine that was noted.  Lungs were clear to auscultation with  slightly decreased breath sounds in  the right lower lobe.  Labs revealed a  hemoglobin of 10.5, platelet count 213,000, WBC count 16.4.   On postoperative day #2, the patient was without complaint.  Vital signs  were stable.  She was afebrile.  The abdomen was soft, with good return of  bowel function.  The abdominal dressing was partially removed, revealing a T-  shaped incisional site.  The fundus was firm and nontender.  Lungs were  clear to auscultation.   On postoperative day #3, the patient did complain of some incisional pain.  The baby remained in the NICU in stable condition.  Vital signs were stable.  The abdomen was soft, with good return of bowel function.  The fundus was  firm and nontender.  The incision was clean, dry, and intact.  She was  ambulating well.   On postoperative day #4, the patient did complain of some chest tightness.  She was still not ambulating well.  She did complain of some incisional  pain.  Vital signs were stable.  The fundus was nontender.  The incision was  clean, dry, and intact.  No wheezes  were heard on auscultation; however,  there was a prolonged expiratory phase.  The patient did have history of  asthma, and she was encouraged to use her inhaler.  Discharge was delayed.   On the following morning, blood pressure was 151/105, 149/101.  She did  complain of some shortness of breath.  Oxygen saturation was 94-95% on room  air.  The patient did complain of her hands feeling tingly and felt anxious  and teary.  She denied headache, blurred vision.  Vital signs were stable.  She was afebrile.  Blood pressures continued to be borderline at 130s-  140s/90s.  The uterus was nontender.  The patient was noted to have some  erythematous rash on her inner thighs which was thought to be Candida.  Mycolog was applied to the affected area, and again, discharge was delayed.   On postoperative day #6, the patient did complain of a visual disturbance;  however, she denied headache or right upper quadrant pain.  Vital signs were  stable.  Blood pressure was 151/105 to 137/88.  Deep tendon reflexes were 1-  2+ without clonus.  She continued to have some pitting pedal edema.  The  abdomen was soft, with good return of bowel function.  The incision was  clean, dry, and intact.  The transverse staples were removed, and PIH labs  were drawn.  Discharge instructions were reviewed with the patient, and  prescriptions for Demerol and Motrin were given.   Upon return of the [facility] labs, which were within normal limits, decision was  made to discharge the patient home.   CONDITION ON DISCHARGE:  Good.   DIET:  Regular as tolerated.   ACTIVITY:  No heavy lifting, no driving x2 weeks, no vaginal entry.   FOLLOW UP:  The patient is to follow up in the office in two days for a  recheck with Dr. Henderson Cloud.  She is to call for a temperature greater than 100  degrees, persistent nausea and vomiting, heavy vaginal bleeding, and/or redness or drainage from the incisional site.   DISCHARGE MEDICATIONS:  1.   Demerol 50 mg one p.o. q.4-6h.  2.  Motrin 600 mg q.6h.  3.  Prenatal vitamins one p.o. daily.      CC/MEDQ  D:  07/31/2004  T:  07/31/2004  Job:  [id number]

## 2011-02-21 NOTE — H&P (Signed)
NAMEMANALI, Kathleen Sullivan                       ACCOUNT NO.:  [id number]   MEDICAL RECORD NO.:  [id number]                   PATIENT TYPE:  MAT   LOCATION:  MATC                                 FACILITY:  [facility]   PHYSICIAN:  Duke Salvia. Marcelle Overlie, M.D.            DATE OF BIRTH:  15-Feb-1976   DATE OF ADMISSION:  06/17/2004  DATE OF DISCHARGE:  06/16/2004                                HISTORY & PHYSICAL   CHIEF COMPLAINT:  For labor induction at 35-1/2 weeks, oligohydramnios.   HISTORY OF PRESENT ILLNESS:  This 35 year old G1, P0, EDC is July 18, 2004.  She is 35-4/7 weeks.  She was seen for her regular visit today and  was noted to have a BP of 128/100, PIH labs from September 11 were all  normal.  She had a reactive NST but ultrasound showed vertex fetus.  AFI,  however was only 2.5 cm.  BPP 6/8.  She is admitted now for labor induction.   Her blood type is A positive.  Rubella titer is immune.  She has a history  of smoking but has been able to stop during this pregnancy.  She also has a  history of asthma.  She is currently on albuterol and Beclovent p.r.n.   ALLERGIES:  None.   PAST SURGICAL HISTORY:  She has had finger surgery.   REVIEW OF SYSTEMS:  Significant for anxiety and depression.   PHYSICAL EXAMINATION:  VITAL SIGNS:  Temperature 98.2, blood pressure  128/100.  HEENT:  Unremarkable.  NECK:  Supple, no masses.  LUNGS:  Clear.  CARDIOVASCULAR:  Regular rate and rhythm without murmurs, rubs or gallops  noted.  BREASTS:  Not examined.  ABDOMEN:  A 36 cm fundal height.  Fetal heart rate 140.  Cervix was closed.  EXTREMITIES:  Reveal 1+ edema.  NEUROLOGIC:  Reflexes 1+, no clonus.   IMPRESSION:  1.  A 35-1/2 weeks intrauterine pregnancy.  2.  Oligohydramnios.   PLAN:  Two stage labor induction.                                               Richard M. Marcelle Overlie, M.D.    RMH/MEDQ  D:  06/17/2004  T:  06/17/2004  Job:  [id number]

## 2011-02-21 NOTE — Discharge Summary (Signed)
Kathleen Sullivan, Kathleen Sullivan             ACCOUNT NO.:  [id number]   MEDICAL RECORD NO.:  [id number]          PATIENT TYPE:  INP   LOCATION:  9121                          [facility]ACILITY:  [facility]   PHYSICIAN:  Freddy Finner, M.D.   DATE OF BIRTH:  1975/11/14   DATE OF ADMISSION:  06/26/2004  DATE OF DISCHARGE:  07/01/2004                                 DISCHARGE SUMMARY   ADMITTING DIAGNOSES:  1.  Postpartum preeclampsia.  2.  Pulmonary edema.   DISCHARGE DIAGNOSES:  1.  Status post cesarean delivery.  2.  Postpartum preeclampsia, resolving.   REASON FOR ADMISSION:  Please see written H&P.   HOSPITAL COURSE:  The patient was a 35 year old white married female status  post cesarean delivery on June 18, 2004 at 35-and-a-half weeks for  oligohydramnios and arrest of descent.  The patient had been noted to have  some postpartum hypertension; however, PIH labs were normal postpartally.  The patient had been discharged home on September 19.  The patient had  presented to the office with complaints of difficulty with breathing while  lying down.  She reports that she sleeps in a chair.  The patient does have  history of asthma; however, she denied wheezing, cough.  She had not found  any relief with pulmonary inhalers.  The patient denied any CNS symptoms or  epigastric pain.  On admission, temperature was 97.9, blood pressure 140s  over 106 in the office, on admission to the hospital 140/87 to 152/90.  Respirations were 24 and heart rate was 70.  There were some decreased  breath sounds at the lung bases; however, no wheezes were noted.  The  patient was noted to have 3+ pitting edema and deep tendon reflexes were 3+  without clonus.  Labs revealed hemoglobin of 9.6, wbc count of 11.9,  platelet count of 618, uric acid was 5.0, LDH was 352, SGOT was 80, SGPT was  84, creatinine was 0.8, potassium level was noted to be 3.2.  Arterial blood  gases were 7.456.  EKG was in normal sinus rhythm.   Chest x-ray was obtained  which revealed some mild interstitial edema with small effusions.  Decision  was made to admit the patient, start her on magnesium sulfate, administer  Lasix, and supplement with potassium sulfate.   Later that evening, the patient was feeling better.  She was able to eat  without stopping to breathe.  Magnesium was going at 2 g/hour.  Blood  pressures were 150s to 170s over 90s to 100.  O2 saturations were 100% on  room air.  Urine output was 3000 mL with one administration of Lasix 20 mg  IV.  Lungs were clear, which was significantly improved.  Deep tendon  reflexes were now 2+.  The patient was given some labetalol intravenously  for blood pressure.  On the following morning, the patient was feeling  better.  However, there was still some significant edema in her lower  extremities.  Blood pressure was 150/88, pulse was 75, oxygen saturation was  95% on room air.  Lungs were clear to auscultation.  Abdomen  was soft.  Incision was clean, dry, and intact.  Extremities were still noted to have 2-  3+ pitting edema.  Labs revealed hemoglobin of 9.4; platelet count of  624,000; wbc count of 10.7.  Decision was to continue with the magnesium  sulfate and place the patient on labetalol 100 mg b.i.d.  On the following  morning, the patient stated she was feeling better.  Blood pressure was  182/105.  Abdomen was soft with good bowel sounds.  Lungs were clear to  auscultation.  Incision was clean, dry, and intact.  Deep tendon reflexes  were 1+.  Urine output was excellent.  Labs revealed hemoglobin of 9.2;  platelet count of 661,000.  SGOT was 32, SGPT was 68.  The patient continued  on Lasix and labetalol.  Later that afternoon, arterial blood gases were  noted to have a low PO2 of 77.  A spiral CT was ordered to rule out a  pulmonary embolus.  CT scan did show some atelectasis with pleural effusion.  Blood pressure was somewhat better with hydrochlorothiazide and  Lasix.  Blood pressure was now 138/85, urine output was 1000 mL over a 2-hour period  of time.  The patient continued on magnesium sulfate, labetalol, and  hydrochlorothiazide.  The following morning, the patient denied headache,  blurred vision, or right upper quadrant pain.  Blood pressure was 150/85.  Lungs were clear to auscultation.  Abdomen was soft.  Incision was clean,  dry, and intact.  She continued to be diuresing.  Decision was made to  discontinue the magnesium sulfate and continue to observe the patient in the  AICU, and recheck labs in the morning.  On the following morning, the  patient continued to deny headache, blurred vision, or right upper quadrant  pain.  Blood pressure was 148/90.  Temperature was 97.6.  Lungs were clear  to auscultation, abdomen soft.  Incision was noted to have slight erythema,  no fluctuance.  Labs revealed hemoglobin of 9.5, LDH was 250, SGOT was 22,  platelet count was 674, SGPT 43, potassium level was 3.4.  Decision was made  to continue with labetalol and hydrochlorothiazide with possible discharge  in the a.m.  On the following morning, the patient denied headache, blurred  vision, or right upper quadrant pain.  Vital signs were stable, temperature  maximum of 99, blood pressure of 138 to 143 over 86 to 92, deep tendon  reflexes were 1+ without clonus.  Abdomen was soft, fundus was firm.  The  incision, which was a vertical incision, was slightly erythematous, no  fluctuance noted.  Transverse incision was clean, dry, and intact.  Lungs  were clear to auscultation.  She was ambulating well, and decision was made  to discharge the patient home.   CONDITION ON DISCHARGE:  Stable.   DIET:  Regular as tolerated.   ACTIVITY:  No heavy lifting, no driving x2 weeks, no vaginal entry.   FOLLOW-UP:  The patient is to follow up in the office in 5-7 days for a recheck.  She is to call for temperature greater than 100 degrees, right  upper quadrant  pain, headache unrelieved by Tylenol, increased erythema to  the incision or drainage from the incisional site.   DISCHARGE MEDICATIONS:  1.  Labetalol 200 mg one p.o. t.i.d.  2.  Hydrochlorothiazide 25 mg.  3.  Augmentin 875 mg one p.o. b.i.d.  4.  Tylox #30 one p.o. q.4-6h.  5.  K-Dur 20 mEq b.i.d.  Catalina Gravel   CC/MEDQ  D:  08/20/2004  T:  08/20/2004  Job:  [id number]

## 2011-02-21 NOTE — Op Note (Signed)
Kathleen Sullivan, Kathleen Sullivan                       ACCOUNT NO.:  [id number]   MEDICAL RECORD NO.:  [id number]                   PATIENT TYPE:  INP   LOCATION:  9145                                 FACILITY:  [facility]   PHYSICIAN:  Guy Sandifer. Arleta Creek, M.D.           DATE OF BIRTH:  03-14-1976   DATE OF PROCEDURE:  06/18/2004  DATE OF DISCHARGE:                                 OPERATIVE REPORT   PREOPERATIVE DIAGNOSES:  1.  Intrauterine pregnancy at 81 1/2 weeks estimated gestational age.  2.  Oligohydramnios.  3.  Arrested descent.   POSTOPERATIVE DIAGNOSES:  1.  Intrauterine pregnancy at 62 1/2 weeks estimated gestational age.  2.  Oligohydramnios.  3.  Arrested descent.   PROCEDURE:  Low transverse cesarean section.   SURGEON:  Guy Sandifer. Henderson Cloud, M.D.   ANESTHESIA:  Epidural, Donald T. Pamalee Leyden, M.D.   ESTIMATED BLOOD LOSS:  1250 mL.   SPECIMENS:  Placenta to pathology.   FINDINGS:  Viable female infant, Apgar's of 5 and 7 at 1 and 5 minutes  respectively, birth weight 5 pounds 12 ounces.  Arterial cord pH 7.13.   INDICATIONS FOR PROCEDURE:  This patient is a 35 year old, married white  female, G1, P0 with an EDC of July 18, 2004 found to have oligohydramnios  on ultrasound in the office. She is admitted on June 17, 2004 for  induction of labor. She undergoes a two stage induction.  Artifical rupture  of membranes with clear fluid is carried out at 6 a.m. on June 18, 2004  and her cervix is 1 cm, 50% dilated.  She progresses to complete in pushing  for approximately 2 1/2 hours.  Vertex is at +1 station with molding and  capit noted.  Diagnosis of arrested descent is made and recommendation for  cesarean section is made.  Potential risks and complications are discussed  preoperative with the patient and her husband including but not limited to  infection, bowel, bladder, ureteral damage, bleeding requiring transfusion  of blood products and possible transfusion  reaction, HIV and hepatitis  acquisition, DVT, PE, pneumonia. All questions are answered and consent is  signed on the chart.   DESCRIPTION OF PROCEDURE:  The patient is taken to the operating room where  she is identified, epidural anesthetic is administered, to the surgical  level and she is placed in the dorsal supine position with a 15 degree left  lateral wedge. She is then prepped and draped in a sterile fashion. A Foley  catheter is already is in place.  The patient had some difficulty attaining  and adequate epidural anesthetic level for surgery.  Her pinch test was  totally numb.  However for additional anesthesia, 10 mL of 0.25% plain  Marcaine is injected over the course of the Pfannenstiel incision. The  Pfannenstiel incision is then made and carried down to the fascia.  The skin  incision is extended 1-2 cm bilaterally to allow  for adequate exposure.  Large subcutaneous retroperitoneal vessels are ligated with 2-0 chromic  suture on the way in.  The peritoneum is then entered and extended  superiorly and inferiorly.  The vesicouterine peritoneum is taken down  cephalolaterally and the bladder flap is developed and the bladder blade is  placed.  The uterus is then incised in a low transverse manner and the  uterine cavity is entered bluntly with the fingertip. The uterine incision  is then extended cephalolaterally with the fingers. At that point, the baby  is noted to be in the right occiput transverse presentation.  It is  extremely tight in the pelvis and I am unable initially to get my fingers  around the vertex.  Therefore the circulating nurse elevates the vertex from  below.  This initially is not successful.  The lower uterine segment is  creating a tight band.  Therefore the lower uterine segment is taken down in  the midline approximately 2 cm.  This also fails to elevate the vertex. At  this point, I had them call for assistance from any obstetrician in house.  In  order to attain better exposure in this 4 foot 11 inch obese patient, a  midline extension of the skin incision is made approximately halfway to the  umbilicus and carried through to the fascia.  At this point, the vertex was  elevated and delivered. The remainder of the baby is delivered. The cord is  clamped and cut and the baby is immediately handed to the waiting pediatrics  team. At this point, Dr. Normand Sloop enters the room.  She then scrubs in to  help close.  After obtaining cord blood, the placenta is manually delivered  and sent to pathology. The uterus is then delivered for better exposure.  The #0 Monocryl is used first to repair the 2-3 cm extension of the lower  uterine segment in the midline without difficulty. This was done well clear  of the bladder.  The uterus is then closed in a running locking fashion with  #0 Monocryl suture. Two figure-of-eights are placed on the right  one-third  of the incision  to obtain complete hemostasis.  Tubes and ovaries were  normal. The uterus is returned to the abdomen and inspection reveals  continued hemostasis.  Copious irrigation is carried out throughout the  process.  The rectus fascia is then closed first on the vertical incision  with widely spaced #0 PDS suture.  A #0 PDS is then used to close the  transverse portion of the fascia starting at each angle and meeting in the  middle.  The subcutaneous tissues are reapproximated with 2-0 plain sutures  and the skin is closed with clips.  A pressure dressing is applied. All  counts are correct. The patient is taken to the recovery room in stable  condition.                                               Guy Sandifer Arleta Creek, M.D.    JET/MEDQ  D:  06/18/2004  T:  06/18/2004  Job:  [id number]

## 2011-03-07 ENCOUNTER — Other Ambulatory Visit: Payer: Self-pay | Admitting: Internal Medicine

## 2011-04-04 ENCOUNTER — Telehealth: Payer: Self-pay | Admitting: *Deleted

## 2011-04-04 NOTE — Telephone Encounter (Signed)
Pt left message requesting refill of medications. Pt states she is unable to make office visit until her insurance changes from Medicaid at the end of this month.  Left message for pt to callback office to clarify which medications she needs refills for.

## 2011-04-07 ENCOUNTER — Other Ambulatory Visit: Payer: Self-pay | Admitting: Internal Medicine

## 2011-04-07 NOTE — Telephone Encounter (Signed)
Called patient, no answer will try again later.

## 2011-04-07 NOTE — Telephone Encounter (Signed)
Pt spoke w/Nancy. She has no more insurance, has apt 7/6. She would like RF of xanax and welbutrin until seen in the office. OK? Oncologist)

## 2011-04-07 NOTE — Telephone Encounter (Signed)
Ok for refill until OV

## 2011-04-08 MED ORDER — ALPRAZOLAM 1 MG PO TABS: 1.0000 mg | ORAL_TABLET | Freq: Every evening | ORAL | Status: DC | PRN

## 2011-04-08 MED ORDER — BUPROPION HCL ER (SR) 150 MG PO TB12: 150.0000 mg | ORAL_TABLET | Freq: Two times a day (BID) | ORAL | Status: DC

## 2011-04-08 NOTE — Telephone Encounter (Signed)
Rx called in// Pt notified, appt 04/11/11

## 2011-04-11 ENCOUNTER — Ambulatory Visit (INDEPENDENT_AMBULATORY_CARE_PROVIDER_SITE_OTHER): Payer: Self-pay | Admitting: Internal Medicine

## 2011-04-11 VITALS — BP 120/82 | HR 84 | Temp 97.9°F | Wt 184.0 lb

## 2011-04-11 DIAGNOSIS — M79609 Pain in unspecified limb: Secondary | ICD-10-CM

## 2011-04-11 DIAGNOSIS — M79643 Pain in unspecified hand: Secondary | ICD-10-CM

## 2011-04-11 MED ORDER — RANITIDINE HCL 150 MG PO TABS: 150.0000 mg | ORAL_TABLET | Freq: Two times a day (BID) | ORAL | Status: DC

## 2011-04-11 MED ORDER — VALACYCLOVIR HCL 1 G PO TABS: 1000.0000 mg | ORAL_TABLET | Freq: Every day | ORAL | Status: DC

## 2011-04-11 MED ORDER — AZELASTINE HCL 0.1 % NA SOLN: 2.0000 | Freq: Every day | NASAL | Status: DC

## 2011-04-11 MED ORDER — FEXOFENADINE-PSEUDOEPHED ER 60-120 MG PO TB12: 1.0000 | ORAL_TABLET | Freq: Every day | ORAL | Status: DC

## 2011-04-11 MED ORDER — DICLOFENAC SODIUM 1 % TD GEL: 1.0000 "application " | Freq: Four times a day (QID) | TRANSDERMAL | Status: DC

## 2011-04-11 MED ORDER — MONTELUKAST SODIUM 10 MG PO TABS: 10.0000 mg | ORAL_TABLET | Freq: Every day | ORAL | Status: DC

## 2011-04-11 MED ORDER — BUPROPION HCL ER (XL) 300 MG PO TB24: 300.0000 mg | ORAL_TABLET | Freq: Every day | ORAL | Status: DC

## 2011-04-11 MED ORDER — ALBUTEROL SULFATE HFA 108 (90 BASE) MCG/ACT IN AERS: 2.0000 | INHALATION_SPRAY | RESPIRATORY_TRACT | Status: DC | PRN

## 2011-04-11 MED ORDER — GABAPENTIN 300 MG PO CAPS: 300.0000 mg | ORAL_CAPSULE | Freq: Three times a day (TID) | ORAL | Status: DC

## 2011-04-11 MED ORDER — ALBUTEROL SULFATE (2.5 MG/3ML) 0.083% IN NEBU: 2.5000 mg | INHALATION_SOLUTION | RESPIRATORY_TRACT | Status: DC | PRN

## 2011-04-11 MED ORDER — FLUTICASONE PROPIONATE 50 MCG/ACT NA SUSP: 1.0000 | Freq: Every day | NASAL | Status: DC

## 2011-04-11 MED ORDER — ESOMEPRAZOLE MAGNESIUM 40 MG PO CPDR: 40.0000 mg | DELAYED_RELEASE_CAPSULE | Freq: Every day | ORAL | Status: DC

## 2011-04-13 NOTE — Progress Notes (Signed)
  Subjective:    Patient ID: Kathleen Sullivan, female    DOB: June 04, 1976, 35 y.o.   MRN: [id number]  HPI Kathleen Sullivan has not been seen for approximately 1 year. She was to have an annula exam and med refills but time ran short and she is asked to reschedule.  CC: she is having hand and arm pain which sounds like Carpal tunnel She is otherwise doing OK   Review of Systems     Objective:   Physical Exam        Assessment & Plan:  Med refill - all her medications are refilled  She is asked to reschedule for routine full exam.  For her hand and arm pain she is counseled on ergonomics for relief of probable carpal tunnel.

## 2011-04-14 ENCOUNTER — Other Ambulatory Visit: Payer: Self-pay | Admitting: *Deleted

## 2011-04-14 MED ORDER — MONTELUKAST SODIUM 10 MG PO TABS: 10.0000 mg | ORAL_TABLET | Freq: Every day | ORAL | Status: DC

## 2011-04-21 ENCOUNTER — Telehealth: Payer: Self-pay | Admitting: *Deleted

## 2011-04-21 NOTE — Telephone Encounter (Signed)
Pts insurance will not cover singulair. Is there any other alternative that you would suggest

## 2011-04-22 NOTE — Telephone Encounter (Signed)
accolate is an alternative but probably not covered either. Chronic asthmatic - we can initiate a PA

## 2011-06-22 ENCOUNTER — Emergency Department (HOSPITAL_COMMUNITY)
Admission: EM | Admit: 2011-06-22 | Discharge: 2011-06-23 | Disposition: A | Discharge status: Home / Self Care | Payer: Medicaid Other | Attending: Emergency Medicine | Admitting: Emergency Medicine

## 2011-06-22 ENCOUNTER — Emergency Department (HOSPITAL_COMMUNITY): Payer: Medicaid Other

## 2011-06-22 DIAGNOSIS — I059 Rheumatic mitral valve disease, unspecified: Secondary | ICD-10-CM | POA: Insufficient documentation

## 2011-06-22 DIAGNOSIS — K219 Gastro-esophageal reflux disease without esophagitis: Secondary | ICD-10-CM | POA: Insufficient documentation

## 2011-06-22 DIAGNOSIS — J40 Bronchitis, not specified as acute or chronic: Secondary | ICD-10-CM | POA: Insufficient documentation

## 2011-06-22 DIAGNOSIS — R05 Cough: Secondary | ICD-10-CM | POA: Insufficient documentation

## 2011-06-22 DIAGNOSIS — R059 Cough, unspecified: Secondary | ICD-10-CM | POA: Insufficient documentation

## 2011-09-26 ENCOUNTER — Telehealth: Payer: Self-pay | Admitting: *Deleted

## 2011-09-26 NOTE — Telephone Encounter (Signed)
Called pt to verify that she still takes Nexium, we received a PA on medication but i see it has been taken off her medication list

## 2011-09-29 ENCOUNTER — Emergency Department (HOSPITAL_COMMUNITY)
Admission: EM | Admit: 2011-09-29 | Discharge: 2011-09-29 | Disposition: A | Discharge status: Home / Self Care | Payer: Medicaid Other | Attending: Emergency Medicine | Admitting: Emergency Medicine

## 2011-09-29 ENCOUNTER — Encounter (HOSPITAL_COMMUNITY): Payer: Self-pay | Admitting: *Deleted

## 2011-09-29 ENCOUNTER — Other Ambulatory Visit: Payer: Self-pay | Admitting: *Deleted

## 2011-09-29 DIAGNOSIS — R509 Fever, unspecified: Secondary | ICD-10-CM | POA: Insufficient documentation

## 2011-09-29 DIAGNOSIS — R070 Pain in throat: Secondary | ICD-10-CM | POA: Insufficient documentation

## 2011-09-29 DIAGNOSIS — R05 Cough: Secondary | ICD-10-CM | POA: Insufficient documentation

## 2011-09-29 DIAGNOSIS — R059 Cough, unspecified: Secondary | ICD-10-CM | POA: Insufficient documentation

## 2011-09-29 DIAGNOSIS — J111 Influenza due to unidentified influenza virus with other respiratory manifestations: Secondary | ICD-10-CM | POA: Insufficient documentation

## 2011-09-29 DIAGNOSIS — K219 Gastro-esophageal reflux disease without esophagitis: Secondary | ICD-10-CM | POA: Insufficient documentation

## 2011-09-29 MED ORDER — FLUTICASONE PROPIONATE 50 MCG/ACT NA SUSP: 1.0000 | Freq: Every day | NASAL | Status: DC

## 2011-09-29 MED ORDER — ALBUTEROL SULFATE HFA 108 (90 BASE) MCG/ACT IN AERS: 2.0000 | INHALATION_SPRAY | RESPIRATORY_TRACT | Status: DC | PRN

## 2011-09-29 NOTE — ED Provider Notes (Signed)
History     CSN: [id number]  Arrival date & time 09/29/11  1503   First MD Initiated Contact with Patient 09/29/11 1536      Chief Complaint  Patient presents with  . Sore Throat    (Consider location/radiation/quality/duration/timing/severity/associated sxs/prior treatment) Patient is a 35 y.o. female presenting with pharyngitis and URI. The history is provided by the patient.  Sore Throat This is a new problem. The current episode started yesterday. The problem occurs constantly. The problem has not changed since onset.Pertinent negatives include no chest pain, no abdominal pain, no headaches and no shortness of breath.  URI The primary symptoms include fever and cough. Primary symptoms do not include headaches, wheezing, abdominal pain or rash. This is a new problem. The problem has not changed since onset. The fever began today. The maximum temperature recorded prior to her arrival was unknown.  The cough began today. The cough is new. The cough is non-productive. There is nondescript sputum produced.  The onset of the illness is associated with exposure to sick contacts. Symptoms associated with the illness include chills and congestion.    Past Medical History  Diagnosis Date  . Other specified personal history presenting hazards to health   . Personal history of unspecified circulatory disease   . Hallux rigidus   . Condyloma acuminatum   . Liveborn infant, unspecified whether single, twin, or multiple, born in hospital, delivered by cesarean   . Unspecified chronic bronchitis   . Allergy, unspecified not elsewhere classified   . Headache   . Other malignant neoplasm without specification of site   . Esophageal reflux   . Unspecified asthma   . Panic disorder without agoraphobia   . Edema     Past Surgical History  Procedure Date  . Finger surgery   . Cesarean section   . Allergies     Family History  Problem Relation Age of Onset  . Breast cancer Mother     . Uterine cancer Mother   . Colon cancer Neg Hx     History  Substance Use Topics  . Smoking status: Never Smoker   . Smokeless tobacco: Not on file  . Alcohol Use: Not on file    OB History    Grav Para Term Preterm Abortions TAB SAB Ect Mult Living                  Review of Systems  Constitutional: Positive for fever and chills.  HENT: Positive for congestion.   Respiratory: Positive for cough. Negative for shortness of breath and wheezing.   Cardiovascular: Negative for chest pain.  Gastrointestinal: Negative for abdominal pain.  Skin: Negative for rash.  Neurological: Negative for headaches.  All other systems reviewed and are negative.    Allergies  Metoclopramide hcl  Home Medications   Current Outpatient Rx  Name Route Sig Dispense Refill  . ALBUTEROL SULFATE HFA 108 (90 BASE) MCG/ACT IN AERS Inhalation Inhale 2 puffs into the lungs every 4 (four) hours as needed.      . ALPRAZOLAM 1 MG PO TABS Oral Take 1 mg by mouth at bedtime as needed. For panic and/or sleep     . AZELASTINE HCL 137 MCG/SPRAY NA SOLN Nasal Place 2 sprays into the nose daily. Use in each nostril as directed     . AZITHROMYCIN 1 % OP SOLN Both Eyes Place 2 drops into both eyes daily as needed.     . BUPROPION HCL ER (XL) 300 MG  PO TB24 Oral Take 300 mg by mouth daily.      Marland Kitchen ESOMEPRAZOLE MAGNESIUM 40 MG PO CPDR Oral Take 1 capsule (40 mg total) by mouth daily. 90 capsule 3  . FLUTICASONE PROPIONATE 50 MCG/ACT NA SUSP Nasal Place 1 spray into the nose daily. 48 g 3  . GABAPENTIN 300 MG PO CAPS Oral Take 1 capsule (300 mg total) by mouth 3 (three) times daily. 270 capsule 3  . MONTELUKAST SODIUM 10 MG PO TABS Oral Take 1 tablet (10 mg total) by mouth at bedtime. 90 tablet 3  . OLOPATADINE HCL 0.2 % OP SOLN Both Eyes Place 1 drop into both eyes every morning.     Marland Kitchen PSEUDOEPHEDRINE HCL 30 MG PO TABS Oral Take 30 mg by mouth 3 (three) times daily as needed. For congestion     . RANITIDINE HCL 150  MG PO TABS Oral Take 1 tablet (150 mg total) by mouth 2 (two) times daily. 180 tablet 3  . VALACYCLOVIR HCL 1 G PO TABS Oral Take 1,000 mg by mouth daily. For cold sores     . DICLOFENAC SODIUM 1 % TD GEL Topical Apply 1 application topically 4 (four) times daily. 1/2 gram to each MTP joint 4 times a day dispense: 100 g.       BP 121/87  Pulse 80  Temp(Src) 98 F (36.7 C) (Oral)  Resp 16  SpO2 98%  LMP 09/22/2011  Physical Exam  Nursing note and vitals reviewed. Constitutional: She is oriented to person, place, and time. She appears well-developed and well-nourished. She is active.  HENT:  Head: Atraumatic.  Nose: Mucosal edema present.  Mouth/Throat: Posterior oropharyngeal edema present. No tonsillar abscesses.  Eyes: Pupils are equal, round, and reactive to light.  Neck: Normal range of motion.  Cardiovascular: Normal rate, regular rhythm, normal heart sounds and intact distal pulses.   Pulmonary/Chest: Effort normal and breath sounds normal.  Abdominal: Soft. Normal appearance.  Musculoskeletal: Normal range of motion.  Neurological: She is alert and oriented to person, place, and time. She has normal reflexes.  Skin: Skin is warm.    ED Course  Procedures (including critical care time)  Labs Reviewed - No data to display No results found.   1. Influenza       MDM  Patient  remains non toxic appearing and at this time most likely viral infection. Daughter with flu as well. Due to hx of high fever  and no hx of flu shot most likely influenza. No concerns of SBI or meningitis a this time          Sabena Winner C. Janeann Paisley, DO 09/29/11 1705

## 2011-09-29 NOTE — ED Notes (Signed)
Pt is c/o sore throat 5/10. Denies fever, no pain meds taken. No cough. Pt also c/o pain in her face, behind her eyes

## 2011-10-03 MED ORDER — ESOMEPRAZOLE MAGNESIUM 40 MG PO CPDR: 40.0000 mg | DELAYED_RELEASE_CAPSULE | Freq: Every day | ORAL | Status: DC

## 2011-10-03 NOTE — Telephone Encounter (Signed)
Pt states she does take Nexium. I will call and initiate PA Called and spoke with dianna through [facility] medicaid Pts medication had been approved throught 12/ 28/2013 called [facility] on [address] and medication went through pt informed she can pick up prescription

## 2011-10-03 NOTE — Telephone Encounter (Signed)
Left a message for pt to call back to verify that she needs Nexium (our office received a PA on this medication) i see on her medication list that she takes ranitidine. I have left pt multiple messages with no response. Will wait for pt to call back

## 2011-10-17 ENCOUNTER — Telehealth: Payer: Self-pay | Admitting: *Deleted

## 2011-10-17 MED ORDER — ALPRAZOLAM 1 MG PO TABS: 1.0000 mg | ORAL_TABLET | Freq: Every evening | ORAL | Status: DC | PRN

## 2011-10-17 NOTE — Telephone Encounter (Signed)
Refill request for alprazolam 1mg  with zero refills. Last filled on 7.3.12. OK to refill?

## 2011-10-17 NOTE — Telephone Encounter (Signed)
OK to fill this prescription with additional refills xO OV w/ Dr Debby Bud Thank you!

## 2011-10-17 NOTE — Telephone Encounter (Signed)
Done

## 2011-11-19 ENCOUNTER — Ambulatory Visit (INDEPENDENT_AMBULATORY_CARE_PROVIDER_SITE_OTHER): Payer: Self-pay | Admitting: Internal Medicine

## 2011-11-19 ENCOUNTER — Encounter: Payer: Self-pay | Admitting: Internal Medicine

## 2011-11-19 DIAGNOSIS — G479 Sleep disorder, unspecified: Secondary | ICD-10-CM

## 2011-11-19 DIAGNOSIS — F410 Panic disorder [episodic paroxysmal anxiety] without agoraphobia: Secondary | ICD-10-CM

## 2011-11-19 DIAGNOSIS — K219 Gastro-esophageal reflux disease without esophagitis: Secondary | ICD-10-CM

## 2011-11-19 DIAGNOSIS — J45909 Unspecified asthma, uncomplicated: Secondary | ICD-10-CM

## 2011-11-19 DIAGNOSIS — G478 Other sleep disorders: Secondary | ICD-10-CM

## 2011-11-19 MED ORDER — ALPRAZOLAM 1 MG PO TABS: 1.0000 mg | ORAL_TABLET | Freq: Every evening | ORAL | Status: DC | PRN

## 2011-11-19 MED ORDER — ZOLPIDEM TARTRATE 10 MG PO TABS: 10.0000 mg | ORAL_TABLET | Freq: Every evening | ORAL | Status: DC | PRN

## 2011-11-19 MED ORDER — ALPRAZOLAM 1 MG PO TABS: ORAL_TABLET | ORAL | Status: DC

## 2011-11-19 NOTE — Progress Notes (Signed)
Subjective:    Patient ID: Kathleen Sullivan, female    DOB: [birth year], 36 y.o.   MRN: [id number]  HPI Kathleen Sullivan presents for follow-up - it has been a while since her last office visit. She is generally feeling well. She does have a problem with sleep which is chronic but at this time she is not going to sleep until 4-5AM, sleeping until mid-day. 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 She has been working on sleep hygiene. She denies any major medical illness, surgery or injury. She does report a life-time of being a poor sleeper, of having difficulty with focus and of having a lot of energy with episodes of less activity.  She reports that she is relatively current for gyn health.  Past Medical History  Diagnosis Date  . Personal history of unspecified circulatory disease   . Hallux rigidus   . Condyloma acuminatum   . Unspecified chronic bronchitis   . Allergy, unspecified not elsewhere classified   . Headache   . Esophageal reflux   . Unspecified asthma   . Panic disorder without agoraphobia   . Other malignant neoplasm without specification of site     skin cancer  . Mild mitral regurgitation   . Mild tricuspid regurgitation    Past Surgical History  Procedure Date  . Finger surgery   . Cesarean section   . Allergies    Family History  Problem Relation Age of Onset  . Breast cancer Mother   . Uterine cancer Mother   . Cancer Mother     breast cancer survivor, uterine cancer survivor  . Mental illness Mother     anxiety disorder  . Colon cancer Neg Hx   . Diabetes Brother   . Cancer Other     breast  . Heart disease Other     CAD  . Stroke Other   . Diabetes Other   . Hypertension Other    History   Social History  . Marital Status: Married    Spouse Name: N/A    Number of Children: 1  . Years of Education: 13   Occupational History  . pharmacy tech   .  student     nursing   Social History Main Topics  . Smoking status: Former Games developer  . Smokeless tobacco: Never Used   Comment: >15 yrs ago.  . Alcohol Use: Yes  . Drug Use: No  . Sexually Active: Yes -- Female partner(s)   Other Topics Concern  . 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 Married '01, 1 dtr - '05. 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   HENT: Negative.   Eyes: Negative.   Respiratory: Negative.   Cardiovascular: Negative.   Gastrointestinal: Negative for constipation and abdominal distention.  Genitourinary: Negative.   Musculoskeletal: Negative.   Skin: Negative.   Neurological: Negative.   Hematological: Negative.   Psychiatric/Behavioral: Positive for sleep disturbance and decreased concentration. The patient is hyperactive.        Objective:   Physical Exam Filed Vitals:   11/19/11 1512  BP: 116/78  Pulse: 79  Temp: 98.1 F (36.7 C)  Resp: 14  Weight: 178 lb 4 oz (80.854 kg)  There is no height on file to calculate BMI.  Gen'l-  WNWD white woman, moderately overweight HEENT- mild proptosis, C&S clear, PERRLA Cor- 2+ radial pulse, RRR Pulm - normal respirations Abd- obese, soft, BS+ Neuro - A&O x 3, rapid speech, normal cognition; CN II-XII ok, normal gait, normal strength       Assessment & Plan:

## 2011-11-21 ENCOUNTER — Encounter: Payer: Self-pay | Admitting: Internal Medicine

## 2011-11-21 DIAGNOSIS — G479 Sleep disorder, unspecified: Secondary | ICD-10-CM | POA: Insufficient documentation

## 2011-11-21 NOTE — Assessment & Plan Note (Signed)
Stable at this time with no recent flares.

## 2011-11-21 NOTE — Assessment & Plan Note (Signed)
Mild symptoms. Has h/o barrett's. Follows with Dr. Juanda Chance. Continues on PPI therapy

## 2011-11-21 NOTE — Assessment & Plan Note (Signed)
Continues to use xanax. She does have behavior and a history that raises possibility of ADD/ADHD vs bipolar dz with more mania than depression. She has not had formal diagnostic evaluation.  Plan - advised to consider psych eval: [facility] - eval and testing for about $450 with self-pay discount

## 2011-11-21 NOTE — Assessment & Plan Note (Signed)
Difficulty with both sleep latency and duration, the former being the greatest problem. She is familiar with sleep hygiene and tries to follow a good sleep hygiene program.  Plan - advised to have an interval between studies and sleep           Advised to have structured contemplative time           Advised to correct her sleep cycle gradually over time          Trial of prn Zolpidem.

## 2011-11-28 ENCOUNTER — Telehealth: Payer: Self-pay | Admitting: *Deleted

## 2011-11-28 NOTE — Telephone Encounter (Signed)
OK. She can call jennifer in Behavioral medicine to schedule her own appointment. She should remind jennifer that I had sent email and that she is to have the assessment and visit at the same time for a discounted price of $431

## 2011-11-28 NOTE — Telephone Encounter (Signed)
Patient would like to get the psych referral discussed at last OV.

## 2011-12-08 NOTE — Telephone Encounter (Signed)
Patient informed. 

## 2011-12-25 ENCOUNTER — Ambulatory Visit (INDEPENDENT_AMBULATORY_CARE_PROVIDER_SITE_OTHER): Payer: Self-pay | Admitting: Psychology

## 2011-12-25 DIAGNOSIS — F988 Other specified behavioral and emotional disorders with onset usually occurring in childhood and adolescence: Secondary | ICD-10-CM

## 2012-01-14 ENCOUNTER — Telehealth: Payer: Self-pay | Admitting: *Deleted

## 2012-01-14 NOTE — Telephone Encounter (Signed)
Patient calling to let you know that psychology results are in; would like to know if your plan is Ov or Rx?

## 2012-01-23 NOTE — Telephone Encounter (Signed)
I have searched EPIC and cannot locate Psychologist report re: ADD/ADHD. Before knowing what to prescribe I need to see report. Please help me locate the same. Thanks.

## 2012-01-27 NOTE — Telephone Encounter (Signed)
Spoke w/patient and gave her Behavioral Health phone# to call & have report faxed to [place] [phone]]/SLS

## 2012-01-28 NOTE — Telephone Encounter (Signed)
Received paperwork; forwarded to MEN/SLS

## 2012-02-02 ENCOUNTER — Telehealth: Payer: Self-pay | Admitting: Internal Medicine

## 2012-02-02 NOTE — Telephone Encounter (Signed)
Called patient to schedule ov (per note on fax from Dr. Dellia Cloud with Attention Deficit Disorder results).  lvmom of patient to callback to schedule.  Notes sent down to order level scanning.

## 2012-02-02 NOTE — Telephone Encounter (Signed)
Ok for OV?

## 2012-02-03 ENCOUNTER — Other Ambulatory Visit: Payer: Self-pay | Admitting: Internal Medicine

## 2012-02-03 ENCOUNTER — Encounter (HOSPITAL_COMMUNITY): Payer: Self-pay | Admitting: Emergency Medicine

## 2012-02-03 ENCOUNTER — Emergency Department (HOSPITAL_COMMUNITY): Payer: Self-pay

## 2012-02-03 ENCOUNTER — Emergency Department (HOSPITAL_COMMUNITY)
Admission: EM | Admit: 2012-02-03 | Discharge: 2012-02-03 | Disposition: A | Discharge status: Home / Self Care | Payer: Self-pay | Attending: Emergency Medicine | Admitting: Emergency Medicine

## 2012-02-03 DIAGNOSIS — R609 Edema, unspecified: Secondary | ICD-10-CM | POA: Insufficient documentation

## 2012-02-03 DIAGNOSIS — R0602 Shortness of breath: Secondary | ICD-10-CM | POA: Insufficient documentation

## 2012-02-03 DIAGNOSIS — M7989 Other specified soft tissue disorders: Secondary | ICD-10-CM | POA: Insufficient documentation

## 2012-02-03 DIAGNOSIS — R600 Localized edema: Secondary | ICD-10-CM

## 2012-02-03 LAB — CBC
HCT: 38.6 % (ref 36.0–46.0)
MCH: 31.5 pg (ref 26.0–34.0)
MCHC: 35.2 g/dL (ref 30.0–36.0)
MCV: 89.4 fL (ref 78.0–100.0)
Platelets: 236 10*3/uL (ref 150–400)
RDW: 12.6 % (ref 11.5–15.5)
WBC: 5.5 10*3/uL (ref 4.0–10.5)

## 2012-02-03 LAB — BASIC METABOLIC PANEL
BUN: 6 mg/dL (ref 6–23)
Calcium: 8.5 mg/dL (ref 8.4–10.5)
Chloride: 105 mEq/L (ref 96–112)
Creatinine, Ser: 0.71 mg/dL (ref 0.50–1.10)
GFR calc Af Amer: >90 mL/min (ref >90)

## 2012-02-03 MED ORDER — LORAZEPAM 1 MG PO TABS
1.0000 mg | ORAL_TABLET | Freq: Once | ORAL | Status: AC
  Administered 2012-02-03: 1 mg via ORAL
  Filled 2012-02-03: qty 1

## 2012-02-03 NOTE — ED Notes (Signed)
Pt states over the weekend she noticed some swelling in her lower extremities that started on Saturday while she was at [facility]  Pt states today she checked her blood pressure and it was high  Pt states she has mitral valve prolapse and tricuspid valve regurgitation and has been in heart failure in the past  Pt is concerned she may be going into that again  Pt is very anxious in words and actions in triage

## 2012-02-03 NOTE — ED Provider Notes (Signed)
Medical screening examination/treatment/procedure(s) were performed by non-physician practitioner and as supervising physician I was immediately available for consultation/collaboration.   Davionna Blacksher M Silvano Garofano, MD 02/03/12 0451 

## 2012-02-03 NOTE — Discharge Instructions (Signed)
Peripheral Edema You have swelling in your legs (peripheral edema). This swelling is due to excess accumulation of salt and water in your body. Edema may be a sign of heart, kidney or liver disease, or a side effect of a medication. It may also be due to problems in the leg veins. Elevating your legs and using special support stockings may be very helpful, if the cause of the swelling is due to poor venous circulation. Avoid long periods of standing, whatever the cause. Treatment of edema depends on identifying the cause. Chips, pretzels, pickles and other salty foods should be avoided. Restricting salt in your diet is almost always needed. Water pills (diuretics) are often used to remove the excess salt and water from your body via urine. These medicines prevent the kidney from reabsorbing sodium. This increases urine flow. Diuretic treatment may also result in lowering of potassium levels in your body. Potassium supplements may be needed if you have to use diuretics daily. Daily weights can help you keep track of your progress in clearing your edema. You should call your caregiver for follow up care as recommended. SEEK IMMEDIATE MEDICAL CARE IF:   You have increased swelling, pain, redness, or heat in your legs.   You develop shortness of breath, especially when lying down.   You develop chest or abdominal pain, weakness, or fainting.   You have a fever.  Document Released: 10/30/2004 Document Revised: 09/11/2011 Document Reviewed: 10/10/2009 [employer] Patient Information 2012 [employer]. 

## 2012-02-03 NOTE — ED Provider Notes (Signed)
History     CSN: [id number]  Arrival date & time 02/03/12  0207   First MD Initiated Contact with Patient 02/03/12 0243      Chief Complaint  Patient presents with  . Leg Swelling  . Hypertension    (Consider location/radiation/quality/duration/timing/severity/associated sxs/prior treatment) HPI  Pt presents to the ED with complaints of bilateral lower extremity swelling and hypertension. The patients BP here in the ED is 153/86, which the patient states is very high for her. [place] was at [place] this [place] for a running event and after the first day there she developed the bilateral leg swelling. She has chronic swelling in her right toe due to a previous injury. But she states it does not usually go up towards her ankles. The patient also admits to having anxiety and ADHD and states she is very very anxious right now. She is not having CP, SOB, difficulty breathing. She is able to lay flat with out any difficulty. Her vital signs are stable. She denies pain in her legs.  Past Medical History  Diagnosis Date  . Personal history of unspecified circulatory disease   . Hallux rigidus   . Condyloma acuminatum   . Unspecified chronic bronchitis   . Allergy, unspecified not elsewhere classified   . Headache   . Esophageal reflux   . Unspecified asthma   . Panic disorder without agoraphobia   . Other malignant neoplasm without specification of site     skin cancer  . Mild mitral regurgitation   . Mild tricuspid regurgitation     Past Surgical History  Procedure Date  . Finger surgery   . Cesarean section   . Allergies     Family History  Problem Relation Age of Onset  . Breast cancer Mother   . Uterine cancer Mother   . Cancer Mother     breast cancer survivor, uterine cancer survivor  . Mental illness Mother     anxiety disorder  . Colon cancer Neg Hx   . Diabetes Brother   . Cancer Other     breast  . Heart disease Other     CAD  . Stroke Other   . Diabetes  Other   . Hypertension Other     History  Substance Use Topics  . Smoking status: Former Games developer  . Smokeless tobacco: Never Used   Comment: >15 yrs ago.  . Alcohol Use: Yes    OB History    Grav Para Term Preterm Abortions TAB SAB Ect Mult Living                  Review of Systems  Allergies  Metoclopramide hcl  Home Medications   Current Outpatient Rx  Name Route Sig Dispense Refill  . ALBUTEROL SULFATE HFA 108 (90 BASE) MCG/ACT IN AERS Inhalation Inhale 2 puffs into the lungs every 4 (four) hours as needed.     . ALPRAZOLAM 1 MG PO TABS  Take 1 or 2 at bedrtime for panic and/or sleep 60 tablet 5    Do not refill med until patient makes an OV with D ...  . AZELASTINE HCL 137 MCG/SPRAY NA SOLN Nasal Place 2 sprays into the nose daily. Use in each nostril as directed     . AZITHROMYCIN 1 % OP SOLN Both Eyes Place 2 drops into both eyes daily as needed.     . BUPROPION HCL ER (XL) 300 MG PO TB24 Oral Take 300 mg by mouth daily.      Marland Kitchen  DICLOFENAC SODIUM 1 % TD GEL Topical Apply 1 application topically 4 (four) times daily. 1/2 gram to each MTP joint 4 times a day dispense: 100 g.     Marland Kitchen DOXYCYCLINE HYCLATE 100 MG PO CAPS Oral Take 100 mg by mouth at bedtime. Acne.    Marland Kitchen FLUTICASONE PROPIONATE 50 MCG/ACT NA SUSP Nasal Place 1 spray into the nose daily. 48 g 3  . GABAPENTIN 300 MG PO CAPS Oral Take 1 capsule (300 mg total) by mouth 3 (three) times daily. 270 capsule 3  . OLOPATADINE HCL 0.2 % OP SOLN Both Eyes Place 1 drop into both eyes every morning.     Marland Kitchen OMEPRAZOLE 20 MG PO CPDR Oral Take 20 mg by mouth daily.    Marland Kitchen PSEUDOEPHEDRINE HCL 30 MG PO TABS Oral Take 30 mg by mouth 3 (three) times daily as needed. For congestion     . RANITIDINE HCL 150 MG PO TABS Oral Take 1 tablet (150 mg total) by mouth 2 (two) times daily. 180 tablet 3  . VALACYCLOVIR HCL 1 G PO TABS Oral Take 1,000 mg by mouth daily. For cold sores     . ZOLPIDEM TARTRATE 10 MG PO TABS Oral Take 10 mg by mouth  daily.    Marland Kitchen ZOLPIDEM TARTRATE 10 MG PO TABS Oral Take 1 tablet (10 mg total) by mouth at bedtime as needed for sleep. 30 tablet 5    BP 115/69  Pulse 78  Temp(Src) 97.7 F (36.5 C) (Oral)  Resp 14  SpO2 99%  LMP 01/25/2012  Physical Exam  Nursing note and vitals reviewed. Constitutional: She appears well-developed and well-nourished. No distress.  HENT:  Head: Normocephalic and atraumatic.  Eyes: Pupils are equal, round, and reactive to light.  Neck: Normal range of motion. Neck supple.  Cardiovascular: Normal rate and regular rhythm.   Pulmonary/Chest: Effort normal.  Abdominal: Soft.  Musculoskeletal:        Equal strength to bilateral lower extremities. Neurosensory function adequate to both legs. Skin color is normal. Skin is warm and moist. I see no step off deformity, no bony tenderness. Pt is able to ambulate without limp.No crepitus, laceration,  effusion.  Pulses are normal.  Pt has bilateral 1+ pitting edema to lower legs with out tenderness or pain  Neurological: She is alert.  Skin: Skin is warm and dry.    ED Course  Procedures (including critical care time)  Labs Reviewed  PRO B NATRIURETIC PEPTIDE - Abnormal; Notable for the following:    Pro B Natriuretic peptide (BNP) 309.3 (*)    All other components within normal limits  BASIC METABOLIC PANEL - Abnormal; Notable for the following:    Glucose, Bld 100 (*)    All other components within normal limits  CBC   Dg Chest 2 View  02/03/2012  *RADIOLOGY REPORT*  Clinical Data: Shortness of breath, lower extremity swelling.  CHEST - 2 VIEW  Comparison: 06/22/2011  Findings: Cardiomediastinal contours are within normal limits.  No focal consolidation, pleural effusion, or pneumothorax.  No acute osseous abnormality.  IMPRESSION: No acute cardiopulmonary process identified.  Original Report Authenticated By: Waneta Martins, M.D.     1. Lower extremity edema       MDM  After speaking again with patient  she admits to wearing spanks for 5 days straight and the swelling started on day 4 and started to resolve after she took them off.  Pt has mild 1+ edema to bilateral lower extremities. My  suspicion for DVT is low because the symptoms started while patient was at [facility] and on feet for a prolonged period of time. Pt is not having pains or difficulty breathing. Lab work up shows a mildly elevated BNP but no indications of acute heart failure. The patient has been given instructions for symptomatic treatment with primary care follow-up and strict return to ED precautions.        Dorthula Matas, PA 02/03/12 [phone]

## 2012-02-04 ENCOUNTER — Telehealth: Payer: Self-pay | Admitting: Internal Medicine

## 2012-02-04 NOTE — Telephone Encounter (Signed)
Left two messages ( 4/29 at 3:41pm , 5/1 at 9:43am ) for patient to make appointment for follow up.

## 2012-02-18 ENCOUNTER — Encounter: Payer: Self-pay | Admitting: Internal Medicine

## 2012-02-18 ENCOUNTER — Ambulatory Visit (INDEPENDENT_AMBULATORY_CARE_PROVIDER_SITE_OTHER): Payer: Self-pay | Admitting: Internal Medicine

## 2012-02-18 VITALS — BP 104/72 | HR 94 | Temp 97.6°F | Resp 16 | Wt 181.0 lb

## 2012-02-18 DIAGNOSIS — F3112 Bipolar disorder, current episode manic without psychotic features, moderate: Secondary | ICD-10-CM

## 2012-02-18 MED ORDER — QUETIAPINE FUMARATE 50 MG PO TABS: 50.0000 mg | ORAL_TABLET | Freq: Every day | ORAL | Status: DC

## 2012-02-18 NOTE — Patient Instructions (Signed)
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 At this point report back as to effect and effectiveness before going to the next dose of 200 mg twice a day.  Check for patient assistance for this drug.  After establishing a steady dose of seroquel we can address the issue of ADD and any need for stimulant medication during the day.

## 2012-02-19 ENCOUNTER — Telehealth: Payer: Self-pay | Admitting: *Deleted

## 2012-02-19 NOTE — Telephone Encounter (Signed)
Left message on voice mail of phone # in system that Rx and info had been faxed to [facility] as she requested.

## 2012-02-21 NOTE — Progress Notes (Signed)
  Subjective:    Patient ID: Kathleen Sullivan, female    DOB: [birth year], 36 y.o.   MRN: [id number]  HPI Kathleen Sullivan recently had psychologic evaluation for ADD. This revealed signs of bipolar disease as well as ADD. She presents today to discuss medical therapy.  PMH, FamHx and SocHx reviewed for any changes and relevance.    Review of Systems System review is negative for any constitutional, cardiac, pulmonary, GI or neuro symptoms or complaints other than as described in the HPI.     Objective:   Physical Exam Filed Vitals:   02/18/12 1046  BP: 104/72  Pulse: 94  Temp: 97.6 F (36.4 C)  Resp: 16   Gen'l - overweight white woman in no distress Cor- RRR Pulm - normal respirations Neuro - A&O x 3, no behavioral issues at today's visit.       Assessment & Plan:

## 2012-02-21 NOTE — Assessment & Plan Note (Signed)
Discussed test results and diagnosis of bipolar disease with mania. She is in counseling intermittently. Discussed medical therapy with the caveat that complex psycho-pharmacology may require a psychiatric consult. She does report that she has tried seroquel with good results.  Plan - Seroquel - starting at 50 mg bid and quickly titrating up to 200 mg bid.   Close follow-up.

## 2012-04-01 ENCOUNTER — Telehealth: Payer: Self-pay | Admitting: Internal Medicine

## 2012-04-01 MED ORDER — SEROQUEL 50 MG PO TABS: 50.0000 mg | ORAL_TABLET | Freq: Two times a day (BID) | ORAL | Status: DC

## 2012-04-01 NOTE — Telephone Encounter (Signed)
rx done and signed and on Side A counter

## 2012-04-01 NOTE — Telephone Encounter (Signed)
Pt sates she was referred to [facility] program to help get her Seroquel rx. She has been working on this and now they need a rx from doctor faxed to them. Please fax to Attn: [facility].  ID# [id number]  And fax # is [phone].

## 2012-04-05 NOTE — Telephone Encounter (Signed)
Patient Rx for seroquel faxed to [facility] Pyo and [facility] Team

## 2012-04-10 ENCOUNTER — Other Ambulatory Visit: Payer: Self-pay | Admitting: Internal Medicine

## 2012-04-12 ENCOUNTER — Other Ambulatory Visit: Payer: Self-pay | Admitting: General Practice

## 2012-04-12 MED ORDER — BUPROPION HCL ER (XL) 300 MG PO TB24: 300.0000 mg | ORAL_TABLET | Freq: Every day | ORAL | Status: DC

## 2012-04-15 ENCOUNTER — Other Ambulatory Visit: Payer: Self-pay | Admitting: *Deleted

## 2012-04-15 MED ORDER — AZELASTINE HCL 0.1 % NA SOLN: 2.0000 | Freq: Every day | NASAL | Status: DC

## 2012-04-15 MED ORDER — VALACYCLOVIR HCL 1 G PO TABS: 1000.0000 mg | ORAL_TABLET | Freq: Every day | ORAL | Status: DC

## 2012-04-15 MED ORDER — MONTELUKAST SODIUM 10 MG PO TABS: 10.0000 mg | ORAL_TABLET | Freq: Every day | ORAL | Status: DC

## 2012-04-15 NOTE — Telephone Encounter (Signed)
Refill on astelin to [facility]

## 2012-04-17 ENCOUNTER — Other Ambulatory Visit: Payer: Self-pay

## 2012-04-17 MED ORDER — GABAPENTIN 300 MG PO CAPS: 300.0000 mg | ORAL_CAPSULE | Freq: Three times a day (TID) | ORAL | Status: DC

## 2012-04-17 MED ORDER — SEROQUEL 50 MG PO TABS: 50.0000 mg | ORAL_TABLET | Freq: Two times a day (BID) | ORAL | Status: DC

## 2012-04-17 NOTE — Telephone Encounter (Signed)
Please advise 

## 2012-04-23 ENCOUNTER — Other Ambulatory Visit: Payer: Self-pay | Admitting: *Deleted

## 2012-04-23 MED ORDER — SEROQUEL 50 MG PO TABS: 50.0000 mg | ORAL_TABLET | Freq: Two times a day (BID) | ORAL | Status: DC

## 2012-04-23 NOTE — Telephone Encounter (Signed)
Sent to [facility]  Refill on quetiapine

## 2012-05-06 ENCOUNTER — Other Ambulatory Visit: Payer: Self-pay | Admitting: *Deleted

## 2012-05-06 MED ORDER — ALBUTEROL SULFATE (2.5 MG/3ML) 0.083% IN NEBU: 2.5000 mg | INHALATION_SOLUTION | Freq: Four times a day (QID) | RESPIRATORY_TRACT | Status: DC | PRN

## 2012-05-06 NOTE — Telephone Encounter (Signed)
Refill sent to [facility] pharmacy.albuterol via nebulizer

## 2012-05-13 ENCOUNTER — Other Ambulatory Visit: Payer: Self-pay | Admitting: *Deleted

## 2012-05-13 MED ORDER — DOXYCYCLINE HYCLATE 100 MG PO CAPS: 100.0000 mg | ORAL_CAPSULE | Freq: Every day | ORAL | Status: DC

## 2012-05-13 MED ORDER — ALPRAZOLAM 1 MG PO TABS: ORAL_TABLET | ORAL | Status: DC

## 2012-05-13 MED ORDER — ZOLPIDEM TARTRATE 10 MG PO TABS: 10.0000 mg | ORAL_TABLET | Freq: Every day | ORAL | Status: DC

## 2012-05-13 NOTE — Addendum Note (Signed)
Addended by: Elnora Morrison on: 05/13/2012 01:44 PM   Modules accepted: Orders

## 2012-05-13 NOTE — Telephone Encounter (Signed)
PATIENT REQEUST A CHANGE IN THE Rx OF DOXCYCLINE 100MG  TABLET DUE TO THE COST OF MEDICATION. PLEASE ADVISE. LAST OV 02/08/2012

## 2012-05-13 NOTE — Telephone Encounter (Signed)
Medication order changed from doxcycline to TMP/SMX . Medication changed of patient med list. Refill for alprazolam . Called to [facility] pharmacy.

## 2012-05-13 NOTE — Telephone Encounter (Signed)
PATIENT ALSO REQUEST REFILL ON MEDICATIONS ZOLPIDEM AND ALPRAZOLAM

## 2012-05-13 NOTE — Telephone Encounter (Signed)
1. Ok for alprazolam refill x 5 2. For acne - may try TMP/SMX 100 mg once a day-add to med list and delete doxy

## 2012-08-13 ENCOUNTER — Other Ambulatory Visit: Payer: Self-pay | Admitting: *Deleted

## 2012-08-13 MED ORDER — ALBUTEROL SULFATE HFA 108 (90 BASE) MCG/ACT IN AERS: 2.0000 | INHALATION_SPRAY | Freq: Four times a day (QID) | RESPIRATORY_TRACT | Status: DC

## 2012-09-08 ENCOUNTER — Other Ambulatory Visit: Payer: Self-pay | Admitting: Internal Medicine

## 2012-10-07 ENCOUNTER — Other Ambulatory Visit: Payer: Self-pay | Admitting: Internal Medicine

## 2012-11-10 ENCOUNTER — Telehealth: Payer: Self-pay | Admitting: *Deleted

## 2012-11-10 NOTE — Telephone Encounter (Signed)
Patient pharmacy request refill on Alprazolam. Last OV 02/18/2012. Please advise.

## 2012-11-10 NOTE — Telephone Encounter (Signed)
Patient  Pharmacy reqeust refill on Zolpidem. Last refill 10/07/2012. Last  OV 02/18/2012

## 2012-11-10 NOTE — Telephone Encounter (Signed)
Needs ov. Can have 1 month refill on zolpidem and alprazolam while waiting for appt.

## 2012-11-11 MED ORDER — ZOLPIDEM TARTRATE 10 MG PO TABS: 10.0000 mg | ORAL_TABLET | Freq: Every day | ORAL | Status: DC

## 2012-11-11 MED ORDER — ALPRAZOLAM 1 MG PO TABS: ORAL_TABLET | ORAL | Status: DC

## 2012-11-11 NOTE — Telephone Encounter (Signed)
Left message on patient phone # of need to make appt. With Dr. Debby Bud for further refills on medications alprazolam and zolpidem.

## 2012-11-30 ENCOUNTER — Telehealth: Payer: Self-pay | Admitting: Internal Medicine

## 2012-11-30 NOTE — Telephone Encounter (Signed)
Pt will need refills on Ambein and Xanax around March 6.  She has made an appt for a physical on March 20.  Will it be ok to refill until she can come for the appt?

## 2012-12-01 ENCOUNTER — Other Ambulatory Visit: Payer: Self-pay | Admitting: *Deleted

## 2012-12-02 MED ORDER — ZOLPIDEM TARTRATE 10 MG PO TABS: 10.0000 mg | ORAL_TABLET | Freq: Every day | ORAL | Status: DC

## 2012-12-02 MED ORDER — ALPRAZOLAM 1 MG PO TABS: ORAL_TABLET | ORAL | Status: DC

## 2012-12-07 ENCOUNTER — Other Ambulatory Visit: Payer: Self-pay | Admitting: *Deleted

## 2012-12-07 NOTE — Telephone Encounter (Signed)
See 11/10/12 telephone note: she was given 12 month refill and told that she needed an OV before any more refills. She will need ov or [place]

## 2012-12-09 ENCOUNTER — Telehealth: Payer: Self-pay | Admitting: *Deleted

## 2012-12-09 ENCOUNTER — Other Ambulatory Visit: Payer: Self-pay

## 2012-12-09 MED ORDER — ZOLPIDEM TARTRATE 10 MG PO TABS: 10.0000 mg | ORAL_TABLET | Freq: Every day | ORAL | Status: DC

## 2012-12-09 MED ORDER — ALPRAZOLAM 1 MG PO TABS: ORAL_TABLET | ORAL | Status: DC

## 2012-12-09 NOTE — Telephone Encounter (Signed)
OK 

## 2012-12-09 NOTE — Telephone Encounter (Signed)
Pt called stating that she has an OV scheduled for 12/23/12. Can we please refill her Rx for xanax and ambien for her?

## 2012-12-09 NOTE — Telephone Encounter (Signed)
Opened in error

## 2012-12-09 NOTE — Telephone Encounter (Signed)
Called pt and LVM that she was given 12 month refill and told that she needed an OV before any more refills. Advised that she will need OV with Dr Debby Bud or she will need to establish with a physician where she lives (at [place]).

## 2012-12-23 ENCOUNTER — Other Ambulatory Visit: Payer: Self-pay

## 2012-12-23 ENCOUNTER — Ambulatory Visit (INDEPENDENT_AMBULATORY_CARE_PROVIDER_SITE_OTHER): Payer: Self-pay | Admitting: Internal Medicine

## 2012-12-23 ENCOUNTER — Encounter: Payer: Self-pay | Admitting: Internal Medicine

## 2012-12-23 VITALS — BP 120/90 | HR 91 | Temp 97.9°F | Resp 16 | Ht 59.0 in | Wt 177.0 lb

## 2012-12-23 DIAGNOSIS — J45909 Unspecified asthma, uncomplicated: Secondary | ICD-10-CM

## 2012-12-23 DIAGNOSIS — T7840XA Allergy, unspecified, initial encounter: Secondary | ICD-10-CM

## 2012-12-23 DIAGNOSIS — Z0000 Encounter for general adult medical examination without abnormal findings: Secondary | ICD-10-CM

## 2012-12-23 DIAGNOSIS — J310 Chronic rhinitis: Secondary | ICD-10-CM

## 2012-12-23 DIAGNOSIS — Z23 Encounter for immunization: Secondary | ICD-10-CM

## 2012-12-23 DIAGNOSIS — R6889 Other general symptoms and signs: Secondary | ICD-10-CM | POA: Insufficient documentation

## 2012-12-23 DIAGNOSIS — R6882 Decreased libido: Secondary | ICD-10-CM

## 2012-12-23 DIAGNOSIS — F3112 Bipolar disorder, current episode manic without psychotic features, moderate: Secondary | ICD-10-CM

## 2012-12-23 MED ORDER — MINOCYCLINE HCL 75 MG PO TABS: 75.0000 mg | ORAL_TABLET | Freq: Two times a day (BID) | ORAL | Status: DC

## 2012-12-23 MED ORDER — BUPROPION HCL ER (XL) 300 MG PO TB24: 300.0000 mg | ORAL_TABLET | Freq: Every day | ORAL | Status: DC

## 2012-12-23 MED ORDER — AZITHROMYCIN 1 % OP SOLN: 2.0000 [drp] | Freq: Every day | OPHTHALMIC | Status: DC | PRN

## 2012-12-23 MED ORDER — ZOLPIDEM TARTRATE 10 MG PO TABS: 10.0000 mg | ORAL_TABLET | Freq: Every day | ORAL | Status: DC

## 2012-12-23 MED ORDER — ALBUTEROL SULFATE (2.5 MG/3ML) 0.083% IN NEBU: 2.5000 mg | INHALATION_SOLUTION | Freq: Four times a day (QID) | RESPIRATORY_TRACT | Status: DC | PRN

## 2012-12-23 MED ORDER — ALPRAZOLAM 1 MG PO TABS: ORAL_TABLET | ORAL | Status: DC

## 2012-12-23 MED ORDER — VALACYCLOVIR HCL 1 G PO TABS: 1000.0000 mg | ORAL_TABLET | Freq: Every day | ORAL | Status: DC

## 2012-12-23 MED ORDER — AZELASTINE HCL 0.1 % NA SOLN: 2.0000 | Freq: Every day | NASAL | Status: DC

## 2012-12-23 MED ORDER — RANITIDINE HCL 150 MG PO TABS: 150.0000 mg | ORAL_TABLET | Freq: Two times a day (BID) | ORAL | Status: DC

## 2012-12-23 MED ORDER — FLUTICASONE PROPIONATE 50 MCG/ACT NA SUSP: 1.0000 | Freq: Every day | NASAL | Status: DC

## 2012-12-23 MED ORDER — OMEPRAZOLE 20 MG PO CPDR: 20.0000 mg | DELAYED_RELEASE_CAPSULE | Freq: Every day | ORAL | Status: DC

## 2012-12-23 MED ORDER — PSEUDOEPHEDRINE HCL 30 MG PO TABS: 30.0000 mg | ORAL_TABLET | Freq: Three times a day (TID) | ORAL | Status: DC | PRN

## 2012-12-23 MED ORDER — GABAPENTIN 300 MG PO CAPS: 300.0000 mg | ORAL_CAPSULE | Freq: Three times a day (TID) | ORAL | Status: DC

## 2012-12-23 MED ORDER — MONTELUKAST SODIUM 10 MG PO TABS: 10.0000 mg | ORAL_TABLET | Freq: Every day | ORAL | Status: DC

## 2012-12-23 MED ORDER — ALBUTEROL SULFATE HFA 108 (90 BASE) MCG/ACT IN AERS: 2.0000 | INHALATION_SPRAY | Freq: Four times a day (QID) | RESPIRATORY_TRACT | Status: DC

## 2012-12-23 NOTE — Patient Instructions (Addendum)
1. Hypothermia, decreased libido raises question of adrenocorticosteroid abnormality peripheral or central. Plan AM cortisol level, thyroid functions, testosterone level  With recommendations to follow  2. Allergy - major problem with suboptimal response to treatment and irrigation. Question of anatomic factors contributing Plan Continue present meds  CT sinus with referral to ENT if needed.   3. Acne - Rx sent in for minocycline 75 mg once a day.  4. Health maintenance - will check cholesterol and chemistries. She is due for Pelvic and PAP. Will give Tdap today.

## 2012-12-23 NOTE — Progress Notes (Signed)
Subjective:    Patient ID: Kathleen Sullivan, female    DOB: 14-Aug-1976, 37 y.o.   MRN: [id number]  HPI Kathleen Sullivan presents for general physical exam and follow- up.   She has complained of running low body temperature and she can feel it - she feels sick   She reports a sudden drop in libido. She reports there is nothing emotionally going on to explain this.  ADD - she did get some medication from [facility] but they are now claiming she should be able for Medicaid.   Past Medical History  Diagnosis Date  . Personal history of unspecified circulatory disease   . Hallux rigidus   . Condyloma acuminatum   . Unspecified chronic bronchitis   . Allergy, unspecified not elsewhere classified   . Headache   . Esophageal reflux   . Unspecified asthma   . Panic disorder without agoraphobia   . Other malignant neoplasm without specification of site     skin cancer  . Mild mitral regurgitation   . Mild tricuspid regurgitation   . Other specified personal history presenting hazards to health     atypical hx of chest pain   Past Surgical History  Procedure Laterality Date  . Finger surgery    . Cesarean section    . Allergies     Family History  Problem Relation Age of Onset  . Breast cancer Mother   . Uterine cancer Mother   . Cancer Mother     breast cancer survivor, uterine cancer survivor  . Mental illness Mother     anxiety disorder  . Colon cancer Neg Hx   . Diabetes Brother   . Cancer Other     breast  . Heart disease Other     CAD  . Stroke Other   . Diabetes Other   . Hypertension Other    History   Social History  . Marital Status: Married    Spouse Name: N/A    Number of Children: 1  . Years of Education: 13   Occupational History  . pharmacy tech   . student     nursing   Social History Main Topics  . Smoking status: Former Games developer  . Smokeless tobacco: Never Used     Comment: >15 yrs ago.  . Alcohol Use: Yes  . Drug Use: No  . Sexually  Active: Yes -- Female partner(s)   Other Topics Concern  . 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 Married '01, 1 dtr - '05. 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 albuterol (PROAIR HFA) 108 (90 BASE) MCG/ACT inhaler Inhale 2 puffs into the lungs 4 (four) times daily.  8.5 g  6  . ALPRAZolam (XANAX) 1 MG tablet Take 1 or 2 at bedrtime for panic and/or sleep  60 tablet  1  . azelastine (ASTELIN) 137 MCG/SPRAY nasal spray Place 2 sprays into the nose daily. Use in each nostril as directed  30 mL  6  . azithromycin (AZASITE) 1 % ophthalmic solution Place 2 drops into both eyes daily as needed.       Marland Kitchen buPROPion (WELLBUTRIN XL) 300 MG 24 hr tablet TAKE 1 TABLET BY MOUTH ONCE DAILY  30 tablet  5  . diclofenac sodium (VOLTAREN) 1 % GEL Apply 1 application topically 4 (four) times daily. 1/2  gram to each MTP joint 4 times a day dispense: 100 g.       . fluticasone (FLONASE) 50 MCG/ACT nasal spray Place 1 spray into the nose daily.  48 g  3  . gabapentin (NEURONTIN) 300 MG capsule Take 1 capsule (300 mg total) by mouth 3 (three) times daily.  270 capsule  3  . montelukast (SINGULAIR) 10 MG tablet Take 1 tablet (10 mg total) by mouth at bedtime.  30 tablet  6  . Olopatadine HCl (PATADAY) 0.2 % SOLN Place 1 drop into both eyes every morning.       Marland Kitchen omeprazole (PRILOSEC) 20 MG capsule Take 20 mg by mouth daily.      . pseudoephedrine (SUDAFED) 30 MG tablet Take 30 mg by mouth 3 (three) times daily as needed. For congestion       . ranitidine (ZANTAC) 150 MG tablet Take 1 tablet (150 mg total) by mouth 2 (two) times daily.  180 tablet  3  . sulfamethoxazole-trimethoprim (BACTRIM,SEPTRA) 400-80 MG per tablet TAKE 1 TABLET BY MOUTH DAILY  30 tablet  0  . valACYclovir (VALTREX) 1000 MG tablet Take 1 tablet (1,000 mg total) by mouth daily. For cold sores  90 tablet  3  .  zolpidem (AMBIEN) 10 MG tablet Take 1 tablet (10 mg total) by mouth daily.  30 tablet  1  . albuterol (PROVENTIL) (2.5 MG/3ML) 0.083% nebulizer solution Take 3 mLs (2.5 mg total) by nebulization every 6 (six) hours as needed.  75 mL  11  . SEROQUEL 50 MG tablet Take 1 tablet (50 mg total) by mouth 2 (two) times daily. Take twice a day  60 tablet  5  . zolpidem (AMBIEN) 10 MG tablet Take 1 tablet (10 mg total) by mouth at bedtime as needed for sleep.  30 tablet  5   No current facility-administered medications on file prior to visit.      Review of Systems System review is negative for any constitutional, cardiac, pulmonary, GI or neuro symptoms or complaints other than as described in the HPI.     Objective:   Physical Exam Filed Vitals:   12/23/12 1009  BP: 120/90  Pulse: 91  Temp: 97.9 F (36.6 C)  Resp: 16   Wt Readings from Last 3 Encounters:  12/23/12 177 lb (80.287 kg)  02/18/12 181 lb (82.101 kg)  11/19/11 178 lb 4 oz (80.854 kg)   Gen'l - overweight white woman in no distress but hyperactive HEENT- mild proptosis, C&S clear, PERRLA (mildly dilated pupils). Oropharynx with normal dentition, normal gum color, posterior pharynx clear Neck - supple, w/o thyromegaly Nodes - negative Breast- deferred Cor - 2+ radial and DP pulses, RRR Pulm - CTAP, normal respirations Abd- obese, soft, no HSM, no fresh stria - has some old stretch marks Pelvic deferred       Assessment & Plan:

## 2012-12-24 ENCOUNTER — Telehealth: Payer: Self-pay

## 2012-12-24 NOTE — Telephone Encounter (Signed)
Katie with [facility] pharmacy called [phone] stated Azasite eye drops are no longer available. Requesting if pt can be switched to another eye drop. Please advise.

## 2012-12-24 NOTE — Telephone Encounter (Signed)
If azesite (azithromycin ophthalmic) is not available the patient will need to get recommendation for substitute from her eye doctor.

## 2012-12-24 NOTE — Telephone Encounter (Signed)
I spoke to [facility] and let her know per Dr Debby Bud she will need to get a recommendation for a substitute for her eye doctor.

## 2012-12-26 DIAGNOSIS — Z0000 Encounter for general adult medical examination without abnormal findings: Secondary | ICD-10-CM | POA: Insufficient documentation

## 2012-12-26 NOTE — Assessment & Plan Note (Signed)
Persistent and continued allergy problems that are not very responsive to medications including oral decongestants and systemic medications. Concern for chronic sinusitis that may require ENT intervention.  Plan CT sinus. Recommendations to follow.

## 2012-12-26 NOTE — Assessment & Plan Note (Signed)
Both low body temperature and loss of libido. Concern for hypothalamic-adrenal axis problems. No stria, normal pubic hair distribution, normal gums.  Plan AM cortisol level  Thyroid functions

## 2012-12-26 NOTE — Assessment & Plan Note (Signed)
No recent flares. She is not having to use rescue inhaler very often.  Plan - continue singulair  PRN use of albuterol for rescue. Did review Wert's "rules of 2."

## 2012-12-26 NOTE — Assessment & Plan Note (Signed)
Interval history with continued ADD/bipolar issues - she is continuing her efforts to get medication assistance and she continues to see psychiatry. She does have an issue with thermo-regulation and loss of libido with work up in progress. She is not current with gyn - needs to schedule an exam. She will return for lab work both focused and routine.  In summary - an interesting woman who does, overall, appear to be medically stable.

## 2012-12-26 NOTE — Assessment & Plan Note (Signed)
Has had more symptoms in part due to difficulty maintaining meds secondary to $$$. She does follow with psychiatry for medication management.

## 2012-12-27 ENCOUNTER — Other Ambulatory Visit: Payer: Self-pay

## 2012-12-27 ENCOUNTER — Telehealth: Payer: Self-pay | Admitting: Internal Medicine

## 2012-12-27 NOTE — Telephone Encounter (Signed)
Dr. Debby Bud Pt no showed for her CT MAXILLOFACIAL LIMITED W/O pt was informed of her appt For 12-27-2012@2 :30 FYI .

## 2012-12-27 NOTE — Telephone Encounter (Signed)
Dr. Norins Pt no showed for her CT MAXILLOFACIAL LIMITED W/O pt was informed of her appt For 12-27-2012@2:30 FYI . ° °

## 2013-02-25 ENCOUNTER — Other Ambulatory Visit: Payer: Self-pay

## 2013-02-25 MED ORDER — ZOLPIDEM TARTRATE 10 MG PO TABS: 10.0000 mg | ORAL_TABLET | Freq: Every day | ORAL | Status: DC

## 2013-03-01 ENCOUNTER — Other Ambulatory Visit: Payer: Self-pay

## 2013-03-02 MED ORDER — ALPRAZOLAM 1 MG PO TABS: ORAL_TABLET | ORAL | Status: DC

## 2013-03-02 NOTE — Telephone Encounter (Signed)
Alprazolam called to pharmacy  

## 2013-03-28 ENCOUNTER — Other Ambulatory Visit: Payer: Self-pay

## 2013-03-28 MED ORDER — ZOLPIDEM TARTRATE 10 MG PO TABS: 10.0000 mg | ORAL_TABLET | Freq: Every day | ORAL | Status: DC

## 2013-03-29 NOTE — Telephone Encounter (Signed)
Zolpidem called to [facility] pharmacy

## 2013-04-05 ENCOUNTER — Other Ambulatory Visit: Payer: Self-pay | Admitting: Internal Medicine

## 2013-05-09 ENCOUNTER — Other Ambulatory Visit: Payer: Self-pay

## 2013-05-09 MED ORDER — ZOLPIDEM TARTRATE 10 MG PO TABS: 10.0000 mg | ORAL_TABLET | Freq: Every day | ORAL | Status: DC

## 2013-05-09 NOTE — Telephone Encounter (Signed)
Zolpidem called to pharmacy  

## 2013-05-16 ENCOUNTER — Other Ambulatory Visit: Payer: Self-pay

## 2013-05-16 MED ORDER — ALPRAZOLAM 1 MG PO TABS: ORAL_TABLET | ORAL | Status: DC

## 2013-05-16 NOTE — Telephone Encounter (Signed)
Alprazolam called to pharmacy  

## 2013-06-07 ENCOUNTER — Other Ambulatory Visit: Payer: Self-pay

## 2013-06-07 MED ORDER — DOXYCYCLINE MONOHYDRATE 100 MG PO TABS: 100.0000 mg | ORAL_TABLET | Freq: Two times a day (BID) | ORAL | Status: DC

## 2013-06-07 NOTE — Telephone Encounter (Signed)
Received a fax from [facility] pharmacy requesting this medication to replace Minocycline. Please advise if okay?

## 2013-06-09 ENCOUNTER — Other Ambulatory Visit: Payer: Self-pay | Admitting: Internal Medicine

## 2013-06-20 ENCOUNTER — Other Ambulatory Visit: Payer: Self-pay

## 2013-06-21 MED ORDER — ALPRAZOLAM 1 MG PO TABS: ORAL_TABLET | ORAL | Status: DC

## 2013-06-21 MED ORDER — ZOLPIDEM TARTRATE 10 MG PO TABS: 10.0000 mg | ORAL_TABLET | Freq: Every day | ORAL | Status: DC

## 2013-07-05 ENCOUNTER — Other Ambulatory Visit: Payer: Self-pay | Admitting: Internal Medicine

## 2013-07-17 ENCOUNTER — Encounter (HOSPITAL_COMMUNITY): Payer: Self-pay | Admitting: Emergency Medicine

## 2013-07-17 ENCOUNTER — Emergency Department (HOSPITAL_COMMUNITY)
Admission: EM | Admit: 2013-07-17 | Discharge: 2013-07-17 | Disposition: A | Discharge status: Home / Self Care | Payer: Self-pay | Attending: Emergency Medicine | Admitting: Emergency Medicine

## 2013-07-17 DIAGNOSIS — K219 Gastro-esophageal reflux disease without esophagitis: Secondary | ICD-10-CM | POA: Insufficient documentation

## 2013-07-17 DIAGNOSIS — G5620 Lesion of ulnar nerve, unspecified upper limb: Secondary | ICD-10-CM | POA: Insufficient documentation

## 2013-07-17 DIAGNOSIS — J45909 Unspecified asthma, uncomplicated: Secondary | ICD-10-CM | POA: Insufficient documentation

## 2013-07-17 DIAGNOSIS — IMO0002 Reserved for concepts with insufficient information to code with codable children: Secondary | ICD-10-CM | POA: Insufficient documentation

## 2013-07-17 DIAGNOSIS — Z8679 Personal history of other diseases of the circulatory system: Secondary | ICD-10-CM | POA: Insufficient documentation

## 2013-07-17 DIAGNOSIS — F410 Panic disorder [episodic paroxysmal anxiety] without agoraphobia: Secondary | ICD-10-CM | POA: Insufficient documentation

## 2013-07-17 DIAGNOSIS — Z79899 Other long term (current) drug therapy: Secondary | ICD-10-CM | POA: Insufficient documentation

## 2013-07-17 DIAGNOSIS — Z87891 Personal history of nicotine dependence: Secondary | ICD-10-CM | POA: Insufficient documentation

## 2013-07-17 DIAGNOSIS — Z792 Long term (current) use of antibiotics: Secondary | ICD-10-CM | POA: Insufficient documentation

## 2013-07-17 DIAGNOSIS — G5622 Lesion of ulnar nerve, left upper limb: Secondary | ICD-10-CM

## 2013-07-17 DIAGNOSIS — Z85828 Personal history of other malignant neoplasm of skin: Secondary | ICD-10-CM | POA: Insufficient documentation

## 2013-07-17 DIAGNOSIS — Z8739 Personal history of other diseases of the musculoskeletal system and connective tissue: Secondary | ICD-10-CM | POA: Insufficient documentation

## 2013-07-17 DIAGNOSIS — Z8619 Personal history of other infectious and parasitic diseases: Secondary | ICD-10-CM | POA: Insufficient documentation

## 2013-07-17 MED ORDER — IBUPROFEN 600 MG PO TABS
600.0000 mg | ORAL_TABLET | Freq: Four times a day (QID) | ORAL | Status: DC
Start: 2013-07-17 — End: 2014-02-26

## 2013-07-17 NOTE — ED Provider Notes (Signed)
CSN: [id number]     Arrival date & time 07/17/13  1754 History   First MD Initiated Contact with Patient 07/17/13 1810     Chief Complaint  Patient presents with  . tingling to fingers    (Consider location/radiation/quality/duration/timing/severity/associated sxs/prior Treatment) HPI Comments: Pt has had 4 days of paresthesias in L 4th & 5th digit w/o known injury. No weakness.  No hx of similar symptoms.   Patient is a 37 y.o. female presenting with neurologic complaint. The history is provided by the patient. No language interpreter was used.  Neurologic Problem This is a new problem. The current episode started more than 2 days ago. The problem occurs constantly. The problem has not changed since onset.Pertinent negatives include no chest pain, no abdominal pain, no headaches and no shortness of breath. Nothing aggravates the symptoms. Nothing relieves the symptoms. She has tried rest for the symptoms. The treatment provided no relief.    Past Medical History  Diagnosis Date  . Personal history of unspecified circulatory disease   . Hallux rigidus   . Condyloma acuminatum   . Unspecified chronic bronchitis   . Allergy, unspecified not elsewhere classified   . Headache(784.0)   . Esophageal reflux   . Unspecified asthma(493.90)   . Panic disorder without agoraphobia   . Other malignant neoplasm without specification of site     skin cancer  . Mild mitral regurgitation   . Mild tricuspid regurgitation   . Other specified personal history presenting hazards to health(V15.89)     atypical hx of chest pain   Past Surgical History  Procedure Laterality Date  . Finger surgery    . Cesarean section    . Allergies     Family History  Problem Relation Age of Onset  . Breast cancer Mother   . Uterine cancer Mother   . Cancer Mother     breast cancer survivor, uterine cancer survivor  . Mental illness Mother     anxiety disorder  . Colon cancer Neg Hx   . Diabetes Brother     . Cancer Other     breast  . Heart disease Other     CAD  . Stroke Other   . Diabetes Other   . Hypertension Other    History  Substance Use Topics  . Smoking status: Former Games developer  . Smokeless tobacco: Never Used     Comment: >15 yrs ago.  . Alcohol Use: Yes   OB History   Grav Para Term Preterm Abortions TAB SAB Ect Mult Living                 Review of Systems  Constitutional: Negative for fever, chills, diaphoresis, activity change, appetite change and fatigue.  HENT: Negative for congestion, facial swelling, rhinorrhea and sore throat.   Eyes: Negative for photophobia and discharge.  Respiratory: Negative for cough, chest tightness and shortness of breath.   Cardiovascular: Negative for chest pain, palpitations and leg swelling.  Gastrointestinal: Negative for nausea, vomiting, abdominal pain and diarrhea.  Endocrine: Negative for polydipsia and polyuria.  Genitourinary: Negative for dysuria, frequency, difficulty urinating and pelvic pain.  Musculoskeletal: Negative for arthralgias, back pain, neck pain and neck stiffness.  Skin: Negative for color change and wound.  Allergic/Immunologic: Negative for immunocompromised state.  Neurological: Positive for numbness. Negative for facial asymmetry, weakness and headaches.  Hematological: Does not bruise/bleed easily.  Psychiatric/Behavioral: Negative for confusion and agitation.    Allergies  Metoclopramide hcl  Home Medications  Current Outpatient Rx  Name  Route  Sig  Dispense  Refill  . albuterol (PROAIR HFA) 108 (90 BASE) MCG/ACT inhaler   Inhalation   Inhale 2 puffs into the lungs 4 (four) times daily.   8.5 g   6   . ALPRAZolam (XANAX) 1 MG tablet      Take 1 or 2 at bedrtime for panic and/or sleep   60 tablet   0   . azelastine (ASTELIN) 137 MCG/SPRAY nasal spray   Nasal   Place 2 sprays into the nose daily. Use in each nostril as directed   30 mL   6   . azithromycin (AZASITE) 1 % ophthalmic  solution   Both Eyes   Place 2 drops into both eyes daily as needed.   2.5 mL   0   . buPROPion (WELLBUTRIN XL) 300 MG 24 hr tablet      TAKE 1 TABLET (300 MG TOTAL) BY MOUTH DAILY.   30 tablet   5   . diclofenac sodium (VOLTAREN) 1 % GEL   Topical   Apply 1 application topically 4 (four) times daily. 1/2 gram to each MTP joint 4 times a day dispense: 100 g.          . fluticasone (FLONASE) 50 MCG/ACT nasal spray   Nasal   Place 1 spray into the nose daily.   48 g   3   . gabapentin (NEURONTIN) 300 MG capsule   Oral   Take 1 capsule (300 mg total) by mouth 3 (three) times daily.   270 capsule   3   . montelukast (SINGULAIR) 10 MG tablet      TAKE 1 TABLET (10 MG TOTAL) BY MOUTH AT BEDTIME.   30 tablet   6   . Olopatadine HCl (PATADAY) 0.2 % SOLN   Both Eyes   Place 1 drop into both eyes every morning.          Marland Kitchen omeprazole (PRILOSEC) 20 MG capsule   Oral   Take 1 capsule (20 mg total) by mouth daily.   30 capsule   5   . ranitidine (ZANTAC) 150 MG tablet   Oral   Take 1 tablet (150 mg total) by mouth 2 (two) times daily.   180 tablet   3   . SUDOGEST 30 MG tablet      TAKE 1 TABLET (30 MG TOTAL) BY MOUTH 3 (THREE)TIMES DAILY AS NEEDED. FOR CONGESTION   24 tablet   1   . valACYclovir (VALTREX) 1000 MG tablet   Oral   Take 1 tablet (1,000 mg total) by mouth daily. For cold sores   90 tablet   3   . zolpidem (AMBIEN) 10 MG tablet   Oral   Take 1 tablet (10 mg total) by mouth daily.   30 tablet   0   . ibuprofen (ADVIL,MOTRIN) 600 MG tablet   Oral   Take 1 tablet (600 mg total) by mouth every 6 (six) hours.   30 tablet   0   . EXPIRED: SEROQUEL 50 MG tablet   Oral   Take 1 tablet (50 mg total) by mouth 2 (two) times daily. Take twice a day   60 tablet   5     Dispense as written.   Marland Kitchen EXPIRED: zolpidem (AMBIEN) 10 MG tablet   Oral   Take 1 tablet (10 mg total) by mouth at bedtime as needed for sleep.   30 tablet  5    BP 140/86   Pulse 114  Temp(Src) 98.2 F (36.8 C) (Oral)  Resp 20  SpO2 97%  LMP 06/17/2013 Physical Exam  Constitutional: She is oriented to person, place, and time. She appears well-developed and well-nourished. No distress.  HENT:  Head: Normocephalic and atraumatic.  Mouth/Throat: No oropharyngeal exudate.  Eyes: Pupils are equal, round, and reactive to light.  Neck: Normal range of motion. Neck supple.  Cardiovascular: Normal rate, regular rhythm and normal heart sounds.  Exam reveals no gallop and no friction rub.   No murmur heard. Pulmonary/Chest: Effort normal and breath sounds normal. No respiratory distress. She has no wheezes. She has no rales.  Abdominal: Soft. Bowel sounds are normal. She exhibits no distension and no mass. There is no tenderness. There is no rebound and no guarding.  Musculoskeletal: Normal range of motion. She exhibits no edema and no tenderness.       Hands: Neurological: She is alert and oriented to person, place, and time.  Skin: Skin is warm and dry.  Psychiatric: She has a normal mood and affect.    ED Course  Procedures (including critical care time) Labs Review Labs Reviewed - No data to display Imaging Review No results found.  EKG Interpretation   None       MDM   1. Ulnar nerve abnormality, left    Pt is a 37 y.o. female with Pmhx as above who presents with 4 days of pins & needles sensation in ulnar distribution in 4th, 5th, digits of left hand.  No known injury to or acute pain of elbow, wrist, arm, shoulder, neck, though has hx of back injury years ago.  No hx of atypical sleeping position.  No other symptoms. PE as above w/ localized symptoms in ulnar distribution w/o motor symptoms.  Suspect peripheral nerve entrapment/compression, but history/PE is not revleaving as to the sight.  Doubt central syndrome such as CVA.  Will start on on regular NSAIDs, ask her to f/u with her PCP tomorrow.    Shanna Cisco, MD 07/17/13 [phone]

## 2013-07-17 NOTE — ED Notes (Signed)
Pt reports numbness/tingling sensation to left ring and pinky finger x4 days. 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

## 2013-07-21 ENCOUNTER — Other Ambulatory Visit: Payer: Self-pay

## 2013-07-21 NOTE — Telephone Encounter (Signed)
Any refills ?

## 2013-07-22 MED ORDER — ALPRAZOLAM 1 MG PO TABS: ORAL_TABLET | ORAL | Status: DC

## 2013-07-26 ENCOUNTER — Other Ambulatory Visit: Payer: Self-pay

## 2013-07-26 MED ORDER — ZOLPIDEM TARTRATE 10 MG PO TABS: 10.0000 mg | ORAL_TABLET | Freq: Every day | ORAL | Status: DC

## 2013-07-26 NOTE — Telephone Encounter (Signed)
Any refills ?

## 2013-07-27 ENCOUNTER — Other Ambulatory Visit: Payer: Self-pay | Admitting: Internal Medicine

## 2013-08-19 ENCOUNTER — Other Ambulatory Visit: Payer: Self-pay

## 2013-08-19 MED ORDER — ALPRAZOLAM 1 MG PO TABS: ORAL_TABLET | ORAL | Status: DC

## 2013-08-30 ENCOUNTER — Other Ambulatory Visit: Payer: Self-pay | Admitting: Internal Medicine

## 2013-09-09 ENCOUNTER — Other Ambulatory Visit: Payer: Self-pay

## 2013-09-09 MED ORDER — ZOLPIDEM TARTRATE 10 MG PO TABS: 10.0000 mg | ORAL_TABLET | Freq: Every day | ORAL | Status: DC

## 2013-09-13 ENCOUNTER — Other Ambulatory Visit: Payer: Self-pay

## 2013-09-13 MED ORDER — ZOLPIDEM TARTRATE 10 MG PO TABS: 10.0000 mg | ORAL_TABLET | Freq: Every day | ORAL | Status: DC

## 2013-09-13 NOTE — Telephone Encounter (Signed)
Script was called to [facility] on the 5th and it should have been called to [facility]. I called Karin Golden to cancel Zolpidem and correctly called this to [facility]

## 2013-09-20 ENCOUNTER — Other Ambulatory Visit: Payer: Self-pay

## 2013-09-20 MED ORDER — ALPRAZOLAM 1 MG PO TABS: ORAL_TABLET | ORAL | Status: DC

## 2013-10-24 ENCOUNTER — Other Ambulatory Visit: Payer: Self-pay

## 2013-10-24 MED ORDER — ALPRAZOLAM 1 MG PO TABS: ORAL_TABLET | ORAL | Status: DC

## 2013-10-24 MED ORDER — ZOLPIDEM TARTRATE 10 MG PO TABS: 10.0000 mg | ORAL_TABLET | Freq: Every day | ORAL | Status: DC

## 2013-11-23 ENCOUNTER — Other Ambulatory Visit: Payer: Self-pay

## 2013-11-23 MED ORDER — ALPRAZOLAM 1 MG PO TABS: ORAL_TABLET | ORAL | Status: DC

## 2013-11-23 MED ORDER — ZOLPIDEM TARTRATE 10 MG PO TABS: 10.0000 mg | ORAL_TABLET | Freq: Every day | ORAL | Status: DC

## 2013-11-23 NOTE — Telephone Encounter (Signed)
I called patient asking her to return call to let her know she will need an office visit before anymore refills.

## 2013-11-23 NOTE — Telephone Encounter (Signed)
May refill for one month - needs OV. Can be squeezed in before March 27th otherwise will need to see new provider

## 2013-11-25 ENCOUNTER — Other Ambulatory Visit: Payer: Self-pay | Admitting: Internal Medicine

## 2013-12-26 ENCOUNTER — Encounter: Payer: Self-pay | Admitting: Internal Medicine

## 2013-12-26 ENCOUNTER — Ambulatory Visit (INDEPENDENT_AMBULATORY_CARE_PROVIDER_SITE_OTHER): Payer: 59 | Admitting: Internal Medicine

## 2013-12-26 VITALS — BP 132/110 | HR 73 | Temp 98.1°F | Wt 176.4 lb

## 2013-12-26 DIAGNOSIS — R6889 Other general symptoms and signs: Secondary | ICD-10-CM

## 2013-12-26 DIAGNOSIS — F3112 Bipolar disorder, current episode manic without psychotic features, moderate: Secondary | ICD-10-CM

## 2013-12-26 DIAGNOSIS — L708 Other acne: Secondary | ICD-10-CM

## 2013-12-26 DIAGNOSIS — J45909 Unspecified asthma, uncomplicated: Secondary | ICD-10-CM

## 2013-12-26 DIAGNOSIS — C801 Malignant (primary) neoplasm, unspecified: Secondary | ICD-10-CM

## 2013-12-26 DIAGNOSIS — M2020 Hallux rigidus, unspecified foot: Secondary | ICD-10-CM

## 2013-12-26 DIAGNOSIS — L709 Acne, unspecified: Secondary | ICD-10-CM

## 2013-12-26 MED ORDER — OLOPATADINE HCL 0.2 % OP SOLN: 1.0000 [drp] | OPHTHALMIC | Status: DC

## 2013-12-26 MED ORDER — MONTELUKAST SODIUM 10 MG PO TABS: ORAL_TABLET | ORAL | Status: DC

## 2013-12-26 MED ORDER — SULFACETAMIDE SODIUM-SULFUR 10-5 % EX FOAM: 1.0000 | Freq: Every day | CUTANEOUS | Status: DC

## 2013-12-26 MED ORDER — TRETINOIN 0.1 % EX CREA: TOPICAL_CREAM | Freq: Every day | CUTANEOUS | Status: DC

## 2013-12-26 MED ORDER — ALPRAZOLAM 1 MG PO TABS: ORAL_TABLET | ORAL | Status: DC

## 2013-12-26 MED ORDER — BUPROPION HCL ER (XL) 300 MG PO TB24: 300.0000 mg | ORAL_TABLET | Freq: Every day | ORAL | Status: DC

## 2013-12-26 MED ORDER — ZOLPIDEM TARTRATE 10 MG PO TABS
10.0000 mg | ORAL_TABLET | Freq: Every day | ORAL | Status: DC
Start: 2013-12-26 — End: 2014-06-22

## 2013-12-26 MED ORDER — SEROQUEL 50 MG PO TABS: 50.0000 mg | ORAL_TABLET | Freq: Two times a day (BID) | ORAL | Status: DC

## 2013-12-26 MED ORDER — VALACYCLOVIR HCL 1 G PO TABS: 2000.0000 mg | ORAL_TABLET | Freq: Two times a day (BID) | ORAL | Status: DC

## 2013-12-26 NOTE — Progress Notes (Signed)
Pre visit review using our clinic review tool, if applicable. No additional management support is needed unless otherwise documented below in the visit note. 

## 2013-12-27 DIAGNOSIS — L709 Acne, unspecified: Secondary | ICD-10-CM | POA: Insufficient documentation

## 2013-12-27 NOTE — Assessment & Plan Note (Signed)
Patient with several concerning skin lesions.  Plan  Refer to Dr. Sarajane Jews whom she has seen in the past.

## 2013-12-27 NOTE — Assessment & Plan Note (Signed)
Seeks referral back to sports medicine - Dr. Tamala Julian. She had formerly been treated by Dr. Oneida Alar.

## 2013-12-27 NOTE — Assessment & Plan Note (Signed)
Stable by patient's report.

## 2013-12-27 NOTE — Patient Instructions (Signed)
Provided three referrals as requested.

## 2013-12-27 NOTE — Assessment & Plan Note (Signed)
Patient now insured and wishes to resume dermatology care.  Requested new Rx for previous meds: Retin A 0.15 cream and Clarifoam.

## 2013-12-27 NOTE — Assessment & Plan Note (Signed)
When seen a year ago for this problem random cortisol and thyroid labs ordered - never done. She continues to c/o lower body temperature.  Plan Referral to endocrinology.

## 2013-12-27 NOTE — Progress Notes (Signed)
Subjective:    Patient ID: Kathleen Sullivan, female    DOB: 01/15/1976, 38 y.o.   MRN: [id number]  HPI Kathleen Sullivan presents for follow up with her last visit being 1 year ago when she complained of decreased body temperature and chronic sinus problems. She had no insurance and was unable to have CT sinus or any labs that were ordered. She is now insured and wishes to arrange for follow up of skin cancer, drop in body temperature, follow up of hallux rigidus as well as getting renewal for psych meds, and acne meds. She reports no major illness in the interval and that she has been doing ok.  Past Medical History  Diagnosis Date  . Personal history of unspecified circulatory disease   . Hallux rigidus   . Condyloma acuminatum   . Unspecified chronic bronchitis   . Allergy, unspecified not elsewhere classified   . Headache(784.0)   . Esophageal reflux   . Unspecified asthma(493.90)   . Panic disorder without agoraphobia   . Other malignant neoplasm without specification of site     skin cancer  . Mild mitral regurgitation   . Mild tricuspid regurgitation   . Other specified personal history presenting hazards to health(V15.89)     atypical hx of chest pain   Past Surgical History  Procedure Laterality Date  . Finger surgery    . Cesarean section    . Allergies     Family History  Problem Relation Age of Onset  . Breast cancer Mother   . Uterine cancer Mother   . Cancer Mother     breast cancer survivor, uterine cancer survivor  . Mental illness Mother     anxiety disorder  . Colon cancer Neg Hx   . Diabetes Brother   . Cancer Other     breast  . Heart disease Other     CAD  . Stroke Other   . Diabetes Other   . Hypertension Other    History   Social History  . Marital Status: Married    Spouse Name: N/A    Number of Children: 1  . Years of Education: 13   Occupational History  . pharmacy tech   . student     nursing   Social History Main Topics  .  Smoking status: Former Research scientist (life sciences)  . Smokeless tobacco: Never Used     Comment: >15 yrs ago.  . Alcohol Use: Yes  . Drug Use: No  . Sexual Activity: Yes    Partners: Male   Other Topics Concern  . 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 Married '01, 1 dtr - '05. 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 albuterol (PROAIR HFA) 108 (90 BASE) MCG/ACT inhaler Inhale 2 puffs into the lungs 4 (four) times daily.  8.5 g  6  . azelastine (ASTELIN) 137 MCG/SPRAY nasal spray Place 2 sprays into the nose daily. Use in each nostril as directed  30 mL  6  . azithromycin (AZASITE) 1 % ophthalmic solution Place 2 drops into both eyes daily as needed.  2.5 mL  0  . diclofenac sodium (VOLTAREN) 1 % GEL Apply 1 application topically 4 (four) times daily. 1/2 gram to each MTP joint 4 times a day dispense: 100 g.       . doxycycline (  MONODOX) 100 MG capsule TAKE 1 CAPSULE BY MOUTH TWICE DAILY  30 capsule  1  . fluticasone (FLONASE) 50 MCG/ACT nasal spray Place 1 spray into the nose daily.  48 g  3  . gabapentin (NEURONTIN) 300 MG capsule TAKE 1 CAPSULE BY MOUTH 3TIMES A DAY  270 capsule  3  . ibuprofen (ADVIL,MOTRIN) 600 MG tablet Take 1 tablet (600 mg total) by mouth every 6 (six) hours.  30 tablet  0  . omeprazole (PRILOSEC) 20 MG capsule Take 1 capsule (20 mg total) by mouth daily.  30 capsule  5  . ranitidine (ZANTAC) 150 MG tablet Take 1 tablet (150 mg total) by mouth 2 (two) times daily.  180 tablet  3  . SUDOGEST 30 MG tablet TAKE 1 TABLET (30 MG TOTAL) BY MOUTH 3 (THREE)TIMES DAILY AS NEEDED. FOR CONGESTION  24 tablet  1  . VENTOLIN HFA 108 (90 BASE) MCG/ACT inhaler INHALE 2 PUFFS INTO THE LUNGS 4 (FOUR) TIMES DAILY.  18 g  5  . zolpidem (AMBIEN) 10 MG tablet Take 1 tablet (10 mg total) by mouth at bedtime as needed for sleep.  30 tablet  5   No current  facility-administered medications on file prior to visit.      Review of Systems System review is negative for any constitutional, cardiac, pulmonary, GI or neuro symptoms or complaints other than as described in the HPI.     Objective:   Physical Exam Filed Vitals:   12/26/13 1519  BP: 132/110  Pulse: 73  Temp: 98.1 F (36.7 C)   Wt Readings from Last 3 Encounters:  12/26/13 176 lb 6.4 oz (80.015 kg)  12/23/12 177 lb (80.287 kg)  02/18/12 181 lb (82.101 kg)   BP Readings from Last 3 Encounters:  12/26/13 132/110  07/17/13 140/86  12/23/12 120/90   Gen'l Overweight woman in no distress HEENT - [place]/AT, C&S clear, PERRLA Cor- 2+ radial, RRR Pul Normal respirations Neuro - awake and alert and calm Derm - mild facial erythema but no open lesions.       Assessment & Plan:

## 2013-12-27 NOTE — Assessment & Plan Note (Signed)
No change in condition - medically managed but not seeing a therapist or psychiatrist.  Plan Resumed seroquel which she had previously been prescribed but couldn't afford - 50 mg bid.

## 2013-12-29 ENCOUNTER — Telehealth: Payer: Self-pay

## 2013-12-29 ENCOUNTER — Other Ambulatory Visit: Payer: Self-pay | Admitting: *Deleted

## 2013-12-29 MED ORDER — ALBUTEROL SULFATE HFA 108 (90 BASE) MCG/ACT IN AERS: 2.0000 | INHALATION_SPRAY | Freq: Four times a day (QID) | RESPIRATORY_TRACT | Status: DC

## 2013-12-29 NOTE — Telephone Encounter (Signed)
Received a fax from patient's insurance that Pataday 0.2% eye solution has been approved for coverage until 12/30/2014.

## 2013-12-29 NOTE — Telephone Encounter (Signed)
Prior authorization request form for Pataday 0.2% eye solutions has been faxed back to [facility] at [phone]

## 2013-12-30 ENCOUNTER — Other Ambulatory Visit: Payer: 59

## 2014-01-04 ENCOUNTER — Ambulatory Visit: Payer: 59 | Admitting: Family Medicine

## 2014-01-06 ENCOUNTER — Ambulatory Visit (INDEPENDENT_AMBULATORY_CARE_PROVIDER_SITE_OTHER)
Admission: RE | Admit: 2014-01-06 | Discharge: 2014-01-06 | Disposition: A | Discharge status: Home / Self Care | Payer: 59 | Source: Ambulatory Visit | Attending: Internal Medicine | Admitting: Internal Medicine

## 2014-01-06 DIAGNOSIS — J310 Chronic rhinitis: Secondary | ICD-10-CM

## 2014-01-06 DIAGNOSIS — T7840XA Allergy, unspecified, initial encounter: Secondary | ICD-10-CM

## 2014-01-07 ENCOUNTER — Encounter: Payer: Self-pay | Admitting: Internal Medicine

## 2014-01-12 ENCOUNTER — Telehealth: Payer: Self-pay | Admitting: Internal Medicine

## 2014-01-12 NOTE — Telephone Encounter (Signed)
Pt of Dr. Linda Hedges who does not have a new pcp yet.  She had a CT April 3 and is requesting results.

## 2014-01-13 ENCOUNTER — Other Ambulatory Visit: Payer: Self-pay | Admitting: Dermatology

## 2014-01-13 ENCOUNTER — Ambulatory Visit: Payer: 59 | Admitting: Family Medicine

## 2014-01-13 NOTE — Telephone Encounter (Signed)
CT scan negative; no sinusitis present

## 2014-01-16 NOTE — Telephone Encounter (Signed)
LMOM @( 10:10am) informing the pt of her CT results below.  Informed the pt to give [place] a call back if she has any more questions.//AB/CMA

## 2014-01-19 ENCOUNTER — Encounter: Payer: Self-pay | Admitting: *Deleted

## 2014-01-31 ENCOUNTER — Telehealth: Payer: Self-pay

## 2014-01-31 NOTE — Telephone Encounter (Signed)
Prior authorization for Seroquel has been faxed back to [facility]

## 2014-02-07 ENCOUNTER — Encounter: Payer: Self-pay | Admitting: Internal Medicine

## 2014-02-07 ENCOUNTER — Ambulatory Visit (INDEPENDENT_AMBULATORY_CARE_PROVIDER_SITE_OTHER): Payer: 59 | Admitting: Internal Medicine

## 2014-02-07 VITALS — BP 104/64 | HR 87 | Temp 98.1°F | Resp 12 | Ht 59.25 in | Wt 179.0 lb

## 2014-02-07 DIAGNOSIS — R6889 Other general symptoms and signs: Secondary | ICD-10-CM

## 2014-02-07 LAB — CBC
HEMATOCRIT: 43 % (ref 36.0–46.0)
Hemoglobin: 14.6 g/dL (ref 12.0–15.0)
MCHC: 34 g/dL (ref 30.0–36.0)
MCV: 91.3 fl (ref 78.0–100.0)
Platelets: 250 10*3/uL (ref 150.0–400.0)
RBC: 4.71 Mil/uL (ref 3.87–5.11)
RDW: 12.8 % (ref 11.5–14.6)
WBC: 10.9 10*3/uL — ABNORMAL HIGH (ref 4.5–10.5)

## 2014-02-07 LAB — T3, FREE: T3 FREE: 3.1 pg/mL (ref 2.3–4.2)

## 2014-02-07 NOTE — Progress Notes (Signed)
Patient ID: Kathleen Sullivan, female   DOB: [birth year], 38 y.o.   MRN: [id number]  Kathleen Sullivan is a 38 y.o. woman referred by PCP, Dr Linda Hedges, for evaluation of low body temperature.  Pt describes that she started to have a low body temperature in the last 2 years. She noticed a big change in her temperature. She feels that she hasa fever when her body temp is in the 69F levels. She has cold sweats. She checked her temp orally and forehead >> range: 96.2-100.33F.   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 Gertie Fey) - weight gain: she cannot exercise anymore 2/2 hallux rigis; she was a runner in the past - repeated episodes of tendonitis - no constipation - + dry skin - + hair falling  - regular menstrual cycles - had Essure sterilization technique in 2010/11  Per review of the chart, her temp has been normal, as measured at previous appts: Vitals - 1 value per visit 12/26/2013 07/17/2013 12/23/2012 02/18/2012  Temperature 98.1 98.2 97.9 97.6   Vitals - 1 value per visit 02/03/2012 11/19/2011 09/29/2011 04/11/2011  Temperature 97.9 98.1 98 97.9   She does not have a h/o hypothyroidism, but there are no new checks available for review: Lab Results  Component Value Date   TSH 1.15 03/16/2009   She is not on Coumadin. 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  No anemia: Lab Results  Component Value Date   WBC 5.5 02/03/2012   HGB 13.6 02/03/2012   HCT 38.6 02/03/2012   MCV 89.4 02/03/2012   PLT 236 02/03/2012   Diet: skips b'fast  Exercise: yoga 5x a week - just started  Review of symptoms: Constitutional: + weight gain,+ weight loss, + fatigue, +subjective hyperthermia or hypothermia, + hot flushes, + poor sleep Eyes:+  blurry vision, no xerophthalmia ENT: + sore throat, no nodules  palpated in throat, + dysphagia/no odynophagia, + hoarseness Cardiovascular: no CP/SOB/+ palpitations/+ leg swelling Respiratory: no cough/SOB Gastrointestinal: no N/V/D/C/+ heartburn Musculoskeletal: + all: muscle/joint aches Skin:+ rash, + easy bruising, + stretch marks (thin, white), + excessive hair growth Neurological: no tremors/numbness/tingling/dizziness, + HA Psychiatric: + both: depression/anxiety  Past Medical History  Diagnosis Date  . Personal history of unspecified circulatory disease   . Hallux rigidus   . Condyloma acuminatum   . Unspecified chronic bronchitis   . Allergy, unspecified not elsewhere classified   . Headache(784.0)   . Esophageal reflux   . Unspecified asthma(493.90)   . Panic disorder without agoraphobia   . Other malignant neoplasm without specification of site     skin cancer  . Mild mitral regurgitation   . Mild tricuspid regurgitation   . Other specified personal history presenting hazards to health(V15.89)     atypical hx of chest pain   Past Surgical History  Procedure Laterality Date  . Finger surgery    . Cesarean section    . Allergies     History   Social History  . Marital Status: Married    Spouse Name: N/A    Number of Children: 1  . Years of Education: 13   Occupational History  . pharmacy tech   . 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 Smoking status: Former Research scientist (life sciences)  . Smokeless tobacco: Never Used     Comment: >15 yrs ago.  . Alcohol Use: Yes  . Drug Use: No  . Sexual Activity: Yes    Partners: Male   Social History Narrative   [employer], doing college studies - online (Feb '13). Married '01, 1 dtr - '05. 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 albuterol (PROAIR HFA) 108 (90 BASE) MCG/ACT inhaler Inhale 2 puffs into the lungs 4 (four) times daily.  8.5 g  6  . ALPRAZolam (XANAX) 1 MG tablet  Take 1 or 2 at bedrtime for panic and/or sleep  60 tablet  5  . azelastine (ASTELIN) 137 MCG/SPRAY nasal spray Place 2 sprays into the nose daily. Use in each nostril as directed  30 mL  6  . azithromycin (AZASITE) 1 % ophthalmic solution Place 2 drops into both eyes daily as needed.  2.5 mL  0  . buPROPion (WELLBUTRIN XL) 300 MG 24 hr tablet Take 1 tablet (300 mg total) by mouth daily.  30 tablet  5  . diclofenac sodium (VOLTAREN) 1 % GEL Apply 1 application topically 4 (four) times daily. 1/2 gram to each MTP joint 4 times a day dispense: 100 g.       . doxycycline (MONODOX) 100 MG capsule TAKE 1 CAPSULE BY MOUTH TWICE DAILY  30 capsule  1  . fluticasone (FLONASE) 50 MCG/ACT nasal spray Place 1 spray into the nose daily.  48 g  3  . gabapentin (NEURONTIN) 300 MG capsule TAKE 1 CAPSULE BY MOUTH 3TIMES A DAY  270 capsule  3  . ibuprofen (ADVIL,MOTRIN) 600 MG tablet Take 1 tablet (600 mg total) by mouth every 6 (six) hours.  30 tablet  0  . montelukast (SINGULAIR) 10 MG tablet TAKE 1 TABLET (10 MG TOTAL) BY MOUTH AT BEDTIME.  30 tablet  5  . Olopatadine HCl (PATADAY) 0.2 % SOLN Place 1 drop into both eyes every morning.  2.5 mL  5  . omeprazole (PRILOSEC) 20 MG capsule Take 1 capsule (20 mg total) by mouth daily.  30 capsule  5  . ranitidine (ZANTAC) 150 MG tablet Take 1 tablet (150 mg total) by mouth 2 (two) times daily.  180 tablet  3  . SUDOGEST 30 MG tablet TAKE 1 TABLET (30 MG TOTAL) BY MOUTH 3 (THREE)TIMES DAILY AS NEEDED. FOR CONGESTION  24 tablet  1  . Sulfacetamide Sodium-Sulfur 10-5 % FOAM Apply 1 Act topically daily.  100 g  3  . tretinoin (RETIN-A) 0.1 % cream Apply topically at bedtime.  45 g  0  . valACYclovir (VALTREX) 1000 MG tablet Take 2 tablets (2,000 mg total) by mouth 2 (two) times daily. Take for one day For cold sores  12 tablet  3  . VENTOLIN HFA 108 (90 BASE) MCG/ACT inhaler INHALE 2 PUFFS INTO THE LUNGS 4 (FOUR) TIMES DAILY.  18 g  5  . zolpidem (AMBIEN) 10 MG tablet Take 1  tablet (10 mg total) by mouth daily.  30 tablet  5  . SEROQUEL 50 MG tablet Take 1 tablet (50 mg total) by mouth 2 (two) times daily. Take twice a day  60 tablet  5  . zolpidem (AMBIEN) 10 MG tablet Take 1 tablet (10 mg total) by mouth at bedtime as needed for sleep.  30 tablet  5  No current facility-administered medications on file prior to visit.   Allergies  Allergen Reactions  . Metoclopramide Hcl Other (See Comments)    "uncontrolled jerking of the mouth"   Family History  Problem Relation Age of Onset  . Breast cancer Mother   . Uterine cancer Mother   . Cancer Mother     breast cancer survivor, uterine cancer survivor  . Mental illness Mother     anxiety disorder  . Colon cancer Neg Hx   . Diabetes Brother   . Cancer Other     breast  . Heart disease Other     CAD  . Stroke Other   . Diabetes Other   . Hypertension Other    Physical exam: BP 104/64  Pulse 87  Temp(Src) 98.1 F (36.7 C) (Oral)  Resp 12  Ht 4' 11.25" (1.505 m)  Wt 179 lb (81.194 kg)  BMI 35.85 kg/m2  SpO2 98% Constitutional: overweight, in NAD Eyes: PERRLA, EOMI, no exophthalmos ENT: moist mucous membranes, no thyromegaly, no cervical lymphadenopathy Cardiovascular: RRR, No MRG Respiratory: CTA B Gastrointestinal: abdomen soft, NT, ND, BS+ Musculoskeletal: no deformities, strength intact in all 4 Skin: moist, warm, no rashes Neurological: + mild tremor with outstretched hands, DTR normal in all 4  Assessment: 1. Low body temperature Reviewed literature - per A.D.A.M. Medical Encyclopedia : "Normal body temperature varies by person, age, activity, and time of day. The average normal body temperature is generally accepted as 98.4F (37C). Some studies have shown that the "normal" body temperature can have a wide range, from 36F (36.1C) to 83F (37.2C)".   Plan: Pt's body temperature is normal per our checks. I explained that a normal body temp can be from 97-99 F. She tells me she is  sometimes lower than 36F. We discussed that a slightly lower body temperature can be: - idiopathic, physiologic - 2/2 hypothyroidism - will check TSH, free t4, free t3 - 2/2 gabapentin - may need to taper down and see if temp improves - 2/2 anemia - she is not anemic per last check in 2013 - will check a new  - she is not on other obvious med triggers of this PCP already ordered a testosterone level and a cortisol. Will check at 8 am, fasting. RTC if labs are abnormal.  Office Visit on 02/07/2014  Component Date Value Ref Range Status  . Testosterone 02/07/2014 54  10 - 70 ng/dL Final   Comment:           Tanner Stage       Female              Female                                        I              < 30 ng/dL        < 10 ng/dL                                        II             < 150 ng/dL       < 30 ng/dL  III            100-320 ng/dL     < 35 ng/dL                                        IV             200-970 ng/dL     15-40 ng/dL                                        V/Adult        300-890 ng/dL     10-70 ng/dL                             . Sex Hormone Binding 02/07/2014 30  18 - 114 nmol/L Final  . Testosterone, Free 02/07/2014 10.3* 0.6 - 6.8 pg/mL Final   Comment:                            The concentration of free testosterone is derived from a mathematical                          expression based on constants for the binding of testosterone to sex                          hormone-binding globulin and albumin.  . Testosterone-% Free 02/07/2014 1.9  0.4 - 2.4 % Final  . WBC 02/07/2014 10.9* 4.5 - 10.5 K/uL Final  . RBC 02/07/2014 4.71  3.87 - 5.11 Mil/uL Final  . Platelets 02/07/2014 250.0  150.0 - 400.0 K/uL Final  . Hemoglobin 02/07/2014 14.6  12.0 - 15.0 g/dL Final  . HCT 02/07/2014 43.0  36.0 - 46.0 % Final  . MCV 02/07/2014 91.3  78.0 - 100.0 fl Final  . MCHC 02/07/2014 34.0  30.0 - 36.0 g/dL Final  . RDW 02/07/2014 12.8  11.5  - 14.6 % Final  . T3, Free 02/07/2014 3.1  2.3 - 4.2 pg/mL Final   Slightly high WBC.  Higher free testosterone, if anything, I would expect this to cause her hyperthermia rather than hypothermia. Sees ObGyn for acne and hirsutism (likely PCOS).  Am cortisol and TSH/fT4 were not drawn. Pt called to return for the rest of the labs. I will addend the results when they become available.

## 2014-02-07 NOTE — Patient Instructions (Signed)
Please stop at the lab. We will schedule a followup appointment if labs are abnormal.

## 2014-02-08 LAB — TESTOSTERONE, FREE, TOTAL, SHBG
Sex Hormone Binding: 30 nmol/L (ref 18–114)
Testosterone, Free: 10.3 pg/mL — ABNORMAL HIGH (ref 0.6–6.8)
Testosterone-% Free: 1.9 % (ref 0.4–2.4)
Testosterone: 54 ng/dL (ref 10–70)

## 2014-02-15 ENCOUNTER — Telehealth: Payer: Self-pay | Admitting: Internal Medicine

## 2014-02-15 NOTE — Telephone Encounter (Signed)
Pt returning call for [facility]. Scheduled an appt for labs for her

## 2014-02-21 ENCOUNTER — Other Ambulatory Visit: Payer: Self-pay | Admitting: *Deleted

## 2014-02-21 ENCOUNTER — Other Ambulatory Visit (INDEPENDENT_AMBULATORY_CARE_PROVIDER_SITE_OTHER): Payer: 59

## 2014-02-21 DIAGNOSIS — R6889 Other general symptoms and signs: Secondary | ICD-10-CM

## 2014-02-21 DIAGNOSIS — R6882 Decreased libido: Secondary | ICD-10-CM

## 2014-02-21 LAB — TSH: TSH: 1.51 u[IU]/mL (ref 0.35–4.50)

## 2014-02-21 LAB — T4, FREE: Free T4: 0.84 ng/dL (ref 0.60–1.60)

## 2014-02-21 NOTE — Telephone Encounter (Signed)
Received fax pt needing PA on her quetiapine Fumarate. Completed PA on cover my meds waiting on approval status...Johny Chess

## 2014-02-23 LAB — CORTISOL-AM, BLOOD: Cortisol - AM: 1.3 ug/dL — ABNORMAL LOW (ref 6.2–19.4)

## 2014-02-26 ENCOUNTER — Emergency Department (HOSPITAL_COMMUNITY): Payer: 59

## 2014-02-26 ENCOUNTER — Encounter (HOSPITAL_COMMUNITY): Payer: Self-pay | Admitting: Emergency Medicine

## 2014-02-26 ENCOUNTER — Emergency Department (HOSPITAL_COMMUNITY)
Admission: EM | Admit: 2014-02-26 | Discharge: 2014-02-26 | Disposition: A | Discharge status: Home / Self Care | Payer: 59 | Attending: Emergency Medicine | Admitting: Emergency Medicine

## 2014-02-26 DIAGNOSIS — Z792 Long term (current) use of antibiotics: Secondary | ICD-10-CM | POA: Insufficient documentation

## 2014-02-26 DIAGNOSIS — M25512 Pain in left shoulder: Secondary | ICD-10-CM

## 2014-02-26 DIAGNOSIS — Z23 Encounter for immunization: Secondary | ICD-10-CM | POA: Insufficient documentation

## 2014-02-26 DIAGNOSIS — S46909A Unspecified injury of unspecified muscle, fascia and tendon at shoulder and upper arm level, unspecified arm, initial encounter: Secondary | ICD-10-CM | POA: Insufficient documentation

## 2014-02-26 DIAGNOSIS — Z85828 Personal history of other malignant neoplasm of skin: Secondary | ICD-10-CM | POA: Insufficient documentation

## 2014-02-26 DIAGNOSIS — Z8679 Personal history of other diseases of the circulatory system: Secondary | ICD-10-CM | POA: Insufficient documentation

## 2014-02-26 DIAGNOSIS — IMO0002 Reserved for concepts with insufficient information to code with codable children: Secondary | ICD-10-CM | POA: Insufficient documentation

## 2014-02-26 DIAGNOSIS — Z79899 Other long term (current) drug therapy: Secondary | ICD-10-CM | POA: Insufficient documentation

## 2014-02-26 DIAGNOSIS — Y92009 Unspecified place in unspecified non-institutional (private) residence as the place of occurrence of the external cause: Secondary | ICD-10-CM | POA: Insufficient documentation

## 2014-02-26 DIAGNOSIS — S199XXA Unspecified injury of neck, initial encounter: Principal | ICD-10-CM

## 2014-02-26 DIAGNOSIS — W14XXXA Fall from tree, initial encounter: Secondary | ICD-10-CM

## 2014-02-26 DIAGNOSIS — Z8619 Personal history of other infectious and parasitic diseases: Secondary | ICD-10-CM | POA: Insufficient documentation

## 2014-02-26 DIAGNOSIS — S0993XA Unspecified injury of face, initial encounter: Secondary | ICD-10-CM | POA: Insufficient documentation

## 2014-02-26 DIAGNOSIS — Y9389 Activity, other specified: Secondary | ICD-10-CM | POA: Insufficient documentation

## 2014-02-26 DIAGNOSIS — F4310 Post-traumatic stress disorder, unspecified: Secondary | ICD-10-CM | POA: Insufficient documentation

## 2014-02-26 DIAGNOSIS — Z791 Long term (current) use of non-steroidal anti-inflammatories (NSAID): Secondary | ICD-10-CM | POA: Insufficient documentation

## 2014-02-26 DIAGNOSIS — M542 Cervicalgia: Secondary | ICD-10-CM

## 2014-02-26 DIAGNOSIS — J45909 Unspecified asthma, uncomplicated: Secondary | ICD-10-CM | POA: Insufficient documentation

## 2014-02-26 DIAGNOSIS — Z87891 Personal history of nicotine dependence: Secondary | ICD-10-CM | POA: Insufficient documentation

## 2014-02-26 DIAGNOSIS — K219 Gastro-esophageal reflux disease without esophagitis: Secondary | ICD-10-CM | POA: Insufficient documentation

## 2014-02-26 DIAGNOSIS — W1789XA Other fall from one level to another, initial encounter: Secondary | ICD-10-CM | POA: Insufficient documentation

## 2014-02-26 DIAGNOSIS — S4980XA Other specified injuries of shoulder and upper arm, unspecified arm, initial encounter: Secondary | ICD-10-CM | POA: Insufficient documentation

## 2014-02-26 DIAGNOSIS — Z8739 Personal history of other diseases of the musculoskeletal system and connective tissue: Secondary | ICD-10-CM | POA: Insufficient documentation

## 2014-02-26 MED ORDER — TETANUS-DIPHTH-ACELL PERTUSSIS 5-2.5-18.5 LF-MCG/0.5 IM SUSP
0.5000 mL | Freq: Once | INTRAMUSCULAR | Status: AC
  Administered 2014-02-26: 0.5 mL via INTRAMUSCULAR
  Filled 2014-02-26: qty 0.5

## 2014-02-26 MED ORDER — OXYCODONE-ACETAMINOPHEN 5-325 MG PO TABS: 1.0000 | ORAL_TABLET | ORAL | Status: DC | PRN

## 2014-02-26 MED ORDER — OXYCODONE-ACETAMINOPHEN 5-325 MG PO TABS
2.0000 | ORAL_TABLET | Freq: Once | ORAL | Status: AC
  Administered 2014-02-26: 2 via ORAL
  Filled 2014-02-26: qty 2

## 2014-02-26 MED ORDER — METHOCARBAMOL 500 MG PO TABS: 500.0000 mg | ORAL_TABLET | Freq: Two times a day (BID) | ORAL | Status: DC | PRN

## 2014-02-26 NOTE — ED Provider Notes (Signed)
Medical screening examination/treatment/procedure(s) were conducted as a shared visit with non-physician practitioner(s) and myself.  I personally evaluated the patient during the encounter.   EKG Interpretation   Date/Time:  Sunday Feb 26 2014 20:56:36 EDT Ventricular Rate:  88 PR Interval:  137 QRS Duration: 77 QT Interval:  363 QTC Calculation: 439 R Axis:   35 Text Interpretation:  Sinus rhythm Low voltage, precordial leads Abnormal  R-wave progression, early transition No significant change since last  tracing Confirmed by Keyonte Cookston  MD, Simmone Cape ([id number]) on 02/26/2014 9:39:09 PM      I interviewed and examined the patient. Lungs are CTAB. Cardiac exam wnl. Abdomen soft.  Pt appears well, doubt serious traumatic injury. Imaging non-contrib. Pt ambulatory. Will d/c home.   Blanchard Kelch, MD 02/26/14 [id number]

## 2014-02-26 NOTE — ED Notes (Addendum)
Per pt she was 15 ft up in a tree when she fell out.  Denies LOC.  Pt has pain in left arm and limited ROM.  PERRLA. Pt is A&O x 4.  Pt rates pain in shoulder 8/10, pain in right side of neck 7/10.  Abrasions noted to b/l forearms.

## 2014-02-26 NOTE — ED Provider Notes (Signed)
CSN: [id number]     Arrival date & time 02/26/14  2043 History   First MD Initiated Contact with Patient 02/26/14 2055     Chief Complaint  Patient presents with  . Fall  . Neck Pain     (Consider location/radiation/quality/duration/timing/severity/associated sxs/prior Treatment) Patient is a 38 y.o. female presenting with fall and neck pain. The history is provided by the patient and medical records.  Fall Associated symptoms include arthralgias and neck pain.  Neck Pain  This is a 38 year old female with past medical history significant for anxiety and panic disorder, GERD, asthma, presenting to the ED after a fall from a tree.  Patient states she's on the tree after washing her husband is in. States she reached for a branch above her head but missed it, lost her footing, and she fell out of the tree and landed on her upper back.  She denies LOC.  Patient states she was ambulatory immediately after fall, walked into her home, took a shower, and then developed some neck pain, worse on her right side. She also complains of some upper back pain and left shoulder pain. She denies any current headache, dizziness, lightheadedness, tinnitus, visual disturbance, or change in speech.  Patient is not currently on any anticoagulants.  VS stable on arrival.  Past Medical History  Diagnosis Date  . Personal history of unspecified circulatory disease   . Hallux rigidus   . Condyloma acuminatum   . Unspecified chronic bronchitis   . Allergy, unspecified not elsewhere classified   . Headache(784.0)   . Esophageal reflux   . Unspecified asthma(493.90)   . Panic disorder without agoraphobia   . Other malignant neoplasm without specification of site     skin cancer  . Mild mitral regurgitation   . Mild tricuspid regurgitation   . Other specified personal history presenting hazards to health(V15.89)     atypical hx of chest pain   Past Surgical History  Procedure Laterality Date  . Finger surgery     . Cesarean section    . Allergies     Family History  Problem Relation Age of Onset  . Breast cancer Mother   . Uterine cancer Mother   . Cancer Mother     breast cancer survivor, uterine cancer survivor  . Mental illness Mother     anxiety disorder  . Colon cancer Neg Hx   . Diabetes Brother   . Cancer Other     breast  . Heart disease Other     CAD  . Stroke Other   . Diabetes Other   . Hypertension Other    History  Substance Use Topics  . Smoking status: Former Research scientist (life sciences)  . Smokeless tobacco: Never Used     Comment: >15 yrs ago.  . Alcohol Use: Yes   OB History   Grav Para Term Preterm Abortions TAB SAB Ect Mult Living                 Review of Systems  Musculoskeletal: Positive for arthralgias, back pain and neck pain.  All other systems reviewed and are negative.     Allergies  Metoclopramide hcl  Home Medications   Prior to Admission medications   Medication Sig Start Date End Date Taking? Authorizing Provider  albuterol (PROAIR HFA) 108 (90 BASE) MCG/ACT inhaler Inhale 2 puffs into the lungs 4 (four) times daily. 12/29/13   Neena Rhymes, MD  ALPRAZolam Duanne Moron) 1 MG tablet Take 1 or 2 at bedrtime  for panic and/or sleep 12/26/13   Neena Rhymes, MD  azelastine (ASTELIN) 137 MCG/SPRAY nasal spray Place 2 sprays into the nose daily. Use in each nostril as directed 12/23/12   Neena Rhymes, MD  azithromycin (AZASITE) 1 % ophthalmic solution Place 2 drops into both eyes daily as needed. 12/23/12   Neena Rhymes, MD  buPROPion (WELLBUTRIN XL) 300 MG 24 hr tablet Take 1 tablet (300 mg total) by mouth daily. 12/26/13   Neena Rhymes, MD  diclofenac sodium (VOLTAREN) 1 % GEL Apply 1 application topically 4 (four) times daily. 1/2 gram to each MTP joint 4 times a day dispense: 100 g.  04/11/11   Neena Rhymes, MD  doxycycline (MONODOX) 100 MG capsule TAKE 1 CAPSULE BY MOUTH TWICE DAILY 11/25/13   Neena Rhymes, MD  fluticasone (FLONASE) 50 MCG/ACT  nasal spray Place 1 spray into the nose daily. 12/23/12   Neena Rhymes, MD  gabapentin (NEURONTIN) 300 MG capsule TAKE 1 CAPSULE BY MOUTH 3TIMES A DAY 07/27/13   Neena Rhymes, MD  ibuprofen (ADVIL,MOTRIN) 600 MG tablet Take 1 tablet (600 mg total) by mouth every 6 (six) hours. 07/17/13   Neta Ehlers, MD  Melatonin 10 MG TABS Take 1 tablet by mouth at bedtime.    Historical Provider, MD  montelukast (SINGULAIR) 10 MG tablet TAKE 1 TABLET (10 MG TOTAL) BY MOUTH AT BEDTIME. 12/26/13   Neena Rhymes, MD  Olopatadine HCl (PATADAY) 0.2 % SOLN Place 1 drop into both eyes every morning. 12/26/13   Neena Rhymes, MD  omeprazole (PRILOSEC) 20 MG capsule Take 1 capsule (20 mg total) by mouth daily. 12/23/12   Neena Rhymes, MD  Probiotic Product (PROBIOTIC DAILY) CAPS Take 1 capsule by mouth daily.    Historical Provider, MD  ranitidine (ZANTAC) 150 MG tablet Take 1 tablet (150 mg total) by mouth 2 (two) times daily. 12/23/12   Neena Rhymes, MD  SEROQUEL 50 MG tablet Take 1 tablet (50 mg total) by mouth 2 (two) times daily. Take twice a day 12/26/13 01/25/14  Neena Rhymes, MD  SUDOGEST 30 MG tablet TAKE 1 TABLET (30 MG TOTAL) BY MOUTH 3 (THREE)TIMES DAILY AS NEEDED. FOR CONGESTION 06/09/13   Neena Rhymes, MD  Sulfacetamide Sodium-Sulfur 10-5 % FOAM Apply 1 Act topically daily. 12/26/13   Neena Rhymes, MD  tretinoin (RETIN-A) 0.1 % cream Apply topically at bedtime. 12/26/13   Neena Rhymes, MD  valACYclovir (VALTREX) 1000 MG tablet Take 2 tablets (2,000 mg total) by mouth 2 (two) times daily. Take for one day For cold sores 12/26/13   Neena Rhymes, MD  VENTOLIN HFA 108 (90 BASE) MCG/ACT inhaler INHALE 2 PUFFS INTO THE LUNGS 4 (FOUR) TIMES DAILY. 08/30/13   Neena Rhymes, MD  zolpidem (AMBIEN) 10 MG tablet Take 1 tablet (10 mg total) by mouth at bedtime as needed for sleep. 11/19/11 12/19/11  Neena Rhymes, MD  zolpidem (AMBIEN) 10 MG tablet Take 1 tablet (10 mg total) by  mouth daily. 12/26/13   Neena Rhymes, MD   BP 124/50  Pulse 92  Temp(Src) 97.8 F (36.6 C) (Oral)  Resp 18  Ht 4' 11"  (1.499 m)  Wt 177 lb (80.287 kg)  BMI 35.73 kg/m2  SpO2 99%  LMP 02/12/2014  Physical Exam  Nursing note and vitals reviewed. Constitutional: She is oriented to person, place, and time. She appears well-developed and well-nourished. Cervical collar in  place.  HENT:  Head: Normocephalic and atraumatic.  Mouth/Throat: Oropharynx is clear and moist.  No visible head trauma  Eyes: Conjunctivae and EOM are normal. Pupils are equal, round, and reactive to light.  Cardiovascular: Normal rate, regular rhythm and normal heart sounds.   Pulmonary/Chest: Effort normal and breath sounds normal. No respiratory distress. She has no wheezes.  Abdominal: Soft. Bowel sounds are normal. There is no tenderness. There is no guarding.  Musculoskeletal:       Left shoulder: She exhibits decreased range of motion, tenderness, bony tenderness and pain. She exhibits no deformity.       Thoracic back: She exhibits tenderness, bony tenderness and pain.       Lumbar back: Normal.       Back:       Arms: c-spine immobilized in collar Thoracic back with midline and paraspinal tenderness; no step-off or noted deformities; some abrasions noted to right paraspinal region; full ROM Lumbar spine WNL-- small abrasions to right paraspinal region; full ROM  Neurological: She is alert and oriented to person, place, and time.  AAOx3, answering questions and following appropriately; equal strength UE and LE bilaterally; CN grossly intact; moves all extremities appropriately without ataxia; sensation to light touch intact diffusely; no focal neuro deficits or facial asymmetry appreciated  Skin: Skin is warm and dry.  Multiple abrasions of bilateral forearm, left shin, and back  Psychiatric: She has a normal mood and affect.    ED Course  Procedures (including critical care time) Labs Review Labs  Reviewed - No data to display  Imaging Review Dg Chest 2 View  02/26/2014   CLINICAL DATA:  [place] from tree.  Left-sided chest pain.  EXAM: CHEST  2 VIEW  COMPARISON:  02/03/2012.  FINDINGS: Heart, mediastinum and hila are unremarkable. Lungs are clear. No pleural effusion. No pneumothorax.  Bony thorax is demineralized.  No fractures seen.  IMPRESSION: No active cardiopulmonary disease.   Electronically Signed   By: Lajean Manes M.D.   On: 02/26/2014 21:39   Dg Thoracic Spine 2 View  02/26/2014   CLINICAL DATA:  Golden Circle from a tree.  Left-sided chest pain.  EXAM: THORACIC SPINE - 2 VIEW  COMPARISON:  CT chest 09/20/2004.  Chest x-ray 02/03/2012  FINDINGS: There is slight scoliotic deformity convex right in the upper thoracic region. This curvature has a similar appearance to prior CT and prior 2013 chest radiograph; therefore it is unlikely that there is a fracture in the upper thoracic region. Nevertheless this area is poorly seen on swimmer's view. If patient is point tender in the mid to upper thoracic spine, CT scanning may be warranted.  No gross compression deformity or traumatic subluxation. No obvious paravertebral hematoma.  IMPRESSION: Slight scoliotic deformity convex right upper thoracic region appears to be chronic. Correlate clinically however with areas of pain.   Electronically Signed   By: Rolla Flatten M.D.   On: 02/26/2014 21:46   Dg Scapula Left  02/26/2014   CLINICAL DATA:  [place] from tree, LEFT side chest and LEFT shoulder pain, upper back pain  EXAM: LEFT SCAPULA - 2+ VIEWS  COMPARISON:  None  FINDINGS: Views osseous demineralization.  AC joint alignment normal.  Glenohumeral alignment normal.  No scapular fracture, glenohumeral dislocation or bone destruction.  Visualized LEFT ribs appear intact.  IMPRESSION: No acute osseous abnormalities.   Electronically Signed   By: Lavonia Dana M.D.   On: 02/26/2014 21:41   Ct Head Wo Contrast  02/26/2014  CLINICAL DATA:  Fell 15 feet from a  tree, LEFT shoulder pain  EXAM: CT HEAD WITHOUT CONTRAST  CT CERVICAL SPINE WITHOUT CONTRAST  TECHNIQUE: Multidetector CT imaging of the head and cervical spine was performed following the standard protocol without intravenous contrast. Multiplanar CT image reconstructions of the cervical spine were also generated.  COMPARISON:  None  FINDINGS: CT HEAD FINDINGS  Normal ventricular morphology.  No midline shift or mass effect.  Normal appearance of brain parenchyma.  No intracranial hemorrhage, mass lesion, or acute infarction.  Visualized paranasal sinuses and mastoid air cells clear.  Bones unremarkable.  CT CERVICAL SPINE FINDINGS  Prevertebral soft tissues normal thickness.  Lung apices clear.  Slight disk space narrowing at C5-C6.  Vertebral body and disk space heights otherwise maintained.  No acute fracture, subluxation or bone destruction.  IMPRESSION: No acute intracranial abnormalities.  No acute cervical spine abnormalities.   Electronically Signed   By: Lavonia Dana M.D.   On: 02/26/2014 21:47   Ct Cervical Spine Wo Contrast  02/26/2014   CLINICAL DATA:  [place] Circle 15 feet from a tree, LEFT shoulder pain  EXAM: CT HEAD WITHOUT CONTRAST  CT CERVICAL SPINE WITHOUT CONTRAST  TECHNIQUE: Multidetector CT imaging of the head and cervical spine was performed following the standard protocol without intravenous contrast. Multiplanar CT image reconstructions of the cervical spine were also generated.  COMPARISON:  None  FINDINGS: CT HEAD FINDINGS  Normal ventricular morphology.  No midline shift or mass effect.  Normal appearance of brain parenchyma.  No intracranial hemorrhage, mass lesion, or acute infarction.  Visualized paranasal sinuses and mastoid air cells clear.  Bones unremarkable.  CT CERVICAL SPINE FINDINGS  Prevertebral soft tissues normal thickness.  Lung apices clear.  Slight disk space narrowing at C5-C6.  Vertebral body and disk space heights otherwise maintained.  No acute fracture, subluxation or bone  destruction.  IMPRESSION: No acute intracranial abnormalities.  No acute cervical spine abnormalities.   Electronically Signed   By: Lavonia Dana M.D.   On: 02/26/2014 21:47   Dg Shoulder Left  02/26/2014   CLINICAL DATA:  [place] from tree.  Left shoulder pain.  EXAM: LEFT SHOULDER - 2+ VIEW  COMPARISON:  None.  FINDINGS: No fracture. No dislocation. No significant arthropathic change. Underlying ribs are intact.  IMPRESSION: No fracture or dislocation.   Electronically Signed   By: Lajean Manes M.D.   On: 02/26/2014 21:41     EKG Interpretation None      MDM   Final diagnoses:  Fall from tree as cause of accidental injury at home as place of occurrence  Neck pain  Left shoulder pain   38 year old female with fall from tree. No loss of consciousness. Patient ambulatory after fall.  On arrival she is AAOx3, neuro exam intact.  She is complaining of neck pain, left shoulder pain, and upper back pain.  She has multiple abrasions but no active bleeding or lacerations that require repair. Tetanus updated.  Will obtain images of areas of pain.  Imaging data for acute findings.  Thoracic spine x-ray was noted to have chronic scoliosis-like changes.  Patient given pain medication which has helped her symptoms. She remains neurologically intact.  She has been out of bed and ambulated around the ED without difficulty.  Placed in left shoulder sling for comfort.  Will FU with orthopedics if symptoms not improving in the next few days.  Rx percocet and robaxin for pain.  Discussed plan with patient, he/she acknowledged  understanding and agreed with plan of care.  Return precautions given for new or worsening symptoms.  Larene Pickett, PA-C 02/26/14 [phone]

## 2014-02-26 NOTE — Discharge Instructions (Signed)
Take the prescribed medication as directed for pain. Wear sling for comfort. Follow-up with orthopedics if no improvement in the next few days. Return to the ED for new or worsening symptoms-- dizziness, lightheadedness, changes in speech, difficulty walking, severe headaches, nausea/vomiting, etc.

## 2014-03-01 ENCOUNTER — Other Ambulatory Visit: Payer: Self-pay | Admitting: *Deleted

## 2014-03-01 DIAGNOSIS — R6882 Decreased libido: Secondary | ICD-10-CM

## 2014-03-01 DIAGNOSIS — R6889 Other general symptoms and signs: Secondary | ICD-10-CM

## 2014-03-03 ENCOUNTER — Other Ambulatory Visit: Payer: Self-pay | Admitting: Internal Medicine

## 2014-03-04 LAB — CORTISOL-AM, BLOOD: Cortisol - AM: 3.2 ug/dL — ABNORMAL LOW (ref 4.3–22.4)

## 2014-03-06 NOTE — Telephone Encounter (Signed)
Never received PA status. Closing encounter...Kathleen Sullivan

## 2014-03-07 ENCOUNTER — Telehealth: Payer: Self-pay | Admitting: Internal Medicine

## 2014-03-07 ENCOUNTER — Other Ambulatory Visit: Payer: Self-pay | Admitting: Internal Medicine

## 2014-03-07 MED ORDER — HYDROCORTISONE 5 MG PO TABS: ORAL_TABLET | ORAL | Status: DC

## 2014-03-08 LAB — ACTH: C206 ACTH: 8 pg/mL (ref 6–50)

## 2014-03-09 ENCOUNTER — Telehealth: Payer: Self-pay | Admitting: *Deleted

## 2014-03-09 NOTE — Telephone Encounter (Signed)
Kathleen Sullivan,  Yes, the ACTH is low >> continue with the Hydrocortisone and let's schedule an appt next week to discuss. C

## 2014-03-09 NOTE — Telephone Encounter (Signed)
Pt called to find out if her ACTH has resulted yet. Also, she wanted Dr Cruzita Lederer to know that in just 24 hours pt feels like a fog has been lifted from her brain. She feels better and can think clearer. She looks forward to talking with Dr Cruzita Lederer at her appt. Be advised.

## 2014-03-09 NOTE — Telephone Encounter (Signed)
Called pt and advised her that her ACTH is low, continue with the hydrocortisone. Scheduled appt for June 17th. Be advised.

## 2014-03-22 ENCOUNTER — Encounter: Payer: Self-pay | Admitting: Internal Medicine

## 2014-03-22 ENCOUNTER — Ambulatory Visit (INDEPENDENT_AMBULATORY_CARE_PROVIDER_SITE_OTHER): Payer: 59 | Admitting: Internal Medicine

## 2014-03-22 VITALS — BP 104/68 | HR 86 | Temp 97.9°F | Resp 12 | Wt 180.0 lb

## 2014-03-22 DIAGNOSIS — E2749 Other adrenocortical insufficiency: Secondary | ICD-10-CM

## 2014-03-22 LAB — FOLLICLE STIMULATING HORMONE: FSH: 4.7 m[IU]/mL

## 2014-03-22 LAB — LUTEINIZING HORMONE: LH: 5.35 m[IU]/mL

## 2014-03-22 MED ORDER — "SYRINGE/NEEDLE (DISP) 25G X 5/8"" 3 ML MISC": Status: AC

## 2014-03-22 MED ORDER — HYDROCORTISONE NA SUCCINATE PF 100 MG IJ SOLR: INTRAMUSCULAR | Status: AC

## 2014-03-22 MED ORDER — BACTERIOSTATIC WATER(PARABENS) IJ SOLN: INTRAMUSCULAR | Status: AC

## 2014-03-22 NOTE — Progress Notes (Addendum)
Kathleen ID: Kathleen Sullivan, female   DOB: [birth year], 38 y.o.   MRN: [id number]  Kathleen Sullivan Sullivan a 38 y.o. woman initially referred by PCP, Dr Linda Hedges, for evaluation of low body temperature. During investigation for this problem, she was found to have a low cortisol and ACTH, indicative of central (secondary) adrenal insufficiency.  Reviewed history: Pt described that she started to have a low body temperature in the last 2 years. She noticed a big change in her temperature. She was feeling that she had a fever when her body temp was ~63F. She had cold sweats. She checked her temp orally and forehead >> range: 96.2-100.60F.   She also noticed that: - hypomania - worse - decreased memory/concentration - had acne and hirsutism >> has been investigated by ObGyn (Dr. Gertie Fey) - weight gain: she cannot exercise anymore 2/2 hallux rigis; she was a runner in the past - repeated episodes of tendonitis - no constipation - + dry skin - + hair falling  - regular menstrual cycles - had Essure sterilization technique in 2010/11  Per review of the chart, her temp has been normal, as measured at previous appts: Vitals - 1 value per visit 12/26/2013 07/17/2013 12/23/2012 02/18/2012  Temperature 98.1 98.2 97.9 97.6   Vitals - 1 value per visit 02/03/2012 11/19/2011 09/29/2011 04/11/2011  Temperature 97.9 98.1 98 97.9   No h/o hypothyroidism:  Lab Results  Component Value Date   TSH 1.51 02/21/2014   TSH 1.15 03/16/2009   FREET4 0.84 02/21/2014   No anemia: Lab Results  Component Value Date   WBC 10.9* 02/07/2014   HGB 14.6 02/07/2014   HCT 43.0 02/07/2014   MCV 91.3 02/07/2014   PLT 250.0 02/07/2014   [name] on Gabapentin 300 mg tid or bid - 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 I initially thought that this could cause her low body temperature however, a cortisol level returned low. A repeat 8 AM cortisol was also low, and associated with a low ACTH,  indicative of secondary adrenal insufficiency:  Component     Latest Ref Rng 02/21/2014 03/03/2014  Cortisol - AM     4.3 - 22.4 ug/dL 1.3 (L) 3.2 (L)  C206 ACTH     6 - 50 pg/mL  8   We started hydrocortisone 10 mg in a.m. And 5 mg in p.m. - no later than 6 pm. and Kathleen Sullivan feeling much better. She feels her mental fog has disappeared. Her memory Sullivan better. The drowsiness Sullivan gone, too. Her temp also increased a little.   She takes Percocet (for falling from a tree). She may take 14 a week. She was not taking it before the cortisol test.  She denies recent steroid use. She had one cortisone inj in the knee - 8 years ago.   She does not use the steroid nasal spray anymore.   Her testosterone was a little high at last check and in the past, as checked by Dr Gaetano Net.   Her menses are regular.   No N/V/AP. She has HAs: allergies, TMJ. Her appetite increased after started Hydrocortisone.   Review of symptoms: Constitutional: + weight loss, improved fatigue, no subjective hyperthermia or hypothermia, + hot flushes Eyes: noblurry vision, no xerophthalmia ENT: no sore throat, no nodules palpated in throat, dysphagia/no odynophagia, + hoarseness Cardiovascular: no CP/SOB/palpitations/leg swelling Respiratory: no cough/SOB Gastrointestinal: no N/V/D/C/+ heartburn Musculoskeletal: + all: muscle/joint aches Skin:+  rash, + easy bruising, + excessive hair growth Neurological: no tremors/numbness/tingling/dizziness, + HA  I reviewed pt's medications, allergies, PMH, social hx, family hx and no changes required, except as mentioned above.  Physical exam: BP 104/68  Pulse 86  Temp(Src) 97.9 F (36.6 C) (Oral)  Resp 12  Wt 180 lb (81.647 kg)  SpO2 97%  LMP 02/12/2014  Wt Readings from Last 3 Encounters:  03/22/14 180 lb (81.647 kg)  02/26/14 177 lb (80.287 kg)  02/07/14 179 lb (81.194 kg)   Constitutional: overweight, in NAD Eyes: PERRLA, EOMI, no exophthalmos ENT: moist mucous  membranes, no thyromegaly, no cervical lymphadenopathy Cardiovascular: RRR, No MRG Respiratory: CTA B Gastrointestinal: abdomen soft, NT, ND, BS+ Musculoskeletal: no deformities, strength intact in all 4 Skin: moist, warm, no rashes Neurological: + mild tremor with outstretched hands, DTR normal in all 4  Assessment: 1. New dx of secondary adrenal insufficiency  Plan: We had a long discussion about her sxs and how they improved after the addition of hydrocortisone. I do not have the evidence for any exogenous factor determining her adrenal insufficiency other than her use of narcotics, however, she mentions that she started this after her fall from the tree that happened after we checked her first cortisol level. She has been having symptoms for more than 2 years, which improved after addition of hydrocortisone, so likely for adrenal insufficiency Sullivan long-standing. No recent steroid use. - We discussed about sick day rules - please see Kathleen instructions - I advised her to get a medic alert bracelet mentioning adrenal insufficiency - we'll check her pituitary function and then we will most likely need a pituitary MRI to check for adenoma: Orders Placed This Encounter  Procedures  . FSH  . LH  . Estradiol  . Prolactin  . Insulin-like growth factor  . Growth hormone  - I will see her back in 6 months  Component     Latest Ref Rng 03/22/2014  Estradiol, Free      2.76  Estradiol      138  Results received      03/29/14  FSH      4.7  LH      5.35  Prolactin      15.7  Somatomedin (IGF-I)     66 - 336 ng/mL 121  Growth Hormone     0.00 - 8.00 ng/mL <0.10   No other pituitary dysfunction, but we still need to check a pituitary MRI to r/o a possible nonsecreting adenoma.  CLINICAL DATA: Secondary adrenal insufficiency.  EXAM:  MRI HEAD WITHOUT AND WITH CONTRAST  TECHNIQUE:  Multiplanar, multiecho pulse sequences of the brain and surrounding  structures were obtained  without and with intravenous contrast.  CONTRAST: 66m MULTIHANCE GADOBENATE DIMEGLUMINE 529 MG/ML IV SOLN  COMPARISON: Head CT 02/26/2014  FINDINGS:  Dedicated imaging was performed through the sella turcica. There Sullivan  a normal posterior pituitary bright spot. Pituitary infundibulum Sullivan  midline. The pituitary gland Sullivan within normal limits for size and  enhances homogeneously. No pituitary mass Sullivan seen. Optic chiasm Sullivan  normal.  There Sullivan no acute infarct. Ventricles and sulci are normal for age.  There Sullivan no evidence of intracranial hemorrhage, mass, midline  shift, or extra-axial fluid collection. No abnormal brain  parenchymal or meningeal enhancement Sullivan seen.  Orbits are unremarkable. Paranasal sinuses and mastoid air cells are  clear. Major intracranial vascular flow voids are preserved.  Calvarium and scalp soft tissues are unremarkable.  IMPRESSION:  Unremarkable MRI of the brain and pituitary.  Electronically Signed  By: Logan Bores  On: 04/18/2014 21:11  Msg sent: Dear Kathleen Sullivan, There Sullivan no pituitary tumor or any other abnormality on MRI. This Sullivan very reassuring! At this point, it appears that your adrenal insufficiency Sullivan functional, possibly due to medication (your cortisone injection 8 years ago - I have seen this happening after 1 injection!). This may resolve in time, but for now, please stay on the current Hydrocortisone regimen. Sincerely, Philemon Kingdom MD

## 2014-03-22 NOTE — Patient Instructions (Signed)
Please stop at the lab.  Please continue hydrocortisone 10 mg in a.m. and 5 mg in p.m. (no later than 6 PM).   - You absolutely need to take this medication every day and not skip doses. - Please double the dose if you have a fever, for the duration of the fever. - If you cannot take anything by mouth (vomiting) or you have severe diarrhea so that you eliminate the hydrocortisone pills in your stool, please make sure that you get the hydrocortisone in the vein or muscle instead - go to the nearest emergency department/urgent care or you may go to your PCPs office  - Please try to get a MedAlert bracelet or pendant indicating: "Adrenal insufficiency".  Please come back for a follow-up appointment in 6 months

## 2014-03-23 LAB — INSULIN-LIKE GROWTH FACTOR: SOMATOMEDIN (IGF-I): 121 ng/mL (ref 66–336)

## 2014-03-23 LAB — GROWTH HORMONE: Growth Hormone: <0.1 ng/mL (ref 0.00–8.00)

## 2014-03-23 LAB — PROLACTIN: Prolactin: 15.7 ng/mL

## 2014-03-29 LAB — ESTRADIOL, FREE
ESTRADIOL: 138 pg/mL
Estradiol, Free: 2.76 pg/mL

## 2014-04-10 ENCOUNTER — Telehealth: Payer: Self-pay | Admitting: Internal Medicine

## 2014-04-10 NOTE — Telephone Encounter (Signed)
Kathleen Sullivan, can you please check on this/

## 2014-04-10 NOTE — Telephone Encounter (Signed)
Patient is waiting for an MRI to be scheduled for her  She was told to call back if she did not hear from our office   Please advise patient   Thank You

## 2014-04-10 NOTE — Telephone Encounter (Signed)
Mal Amabile, [facility] and lvm letting her know that Dr Cruzita Lederer had ordered a MRI for pt and pt has contacted [place] questioning when this will be done. Daneil Dan to let us know when this is done.

## 2014-04-18 ENCOUNTER — Ambulatory Visit
Admission: RE | Admit: 2014-04-18 | Discharge: 2014-04-18 | Disposition: A | Discharge status: Home / Self Care | Payer: 59 | Source: Ambulatory Visit | Attending: Internal Medicine | Admitting: Internal Medicine

## 2014-04-18 MED ORDER — GADOBENATE DIMEGLUMINE 529 MG/ML IV SOLN
8.0000 mL | Freq: Once | INTRAVENOUS | Status: AC | PRN
Start: 2014-04-18 — End: 2014-04-18
  Administered 2014-04-18: 8 mL via INTRAVENOUS

## 2014-05-25 ENCOUNTER — Other Ambulatory Visit: Payer: Self-pay | Admitting: *Deleted

## 2014-05-25 ENCOUNTER — Telehealth: Payer: Self-pay | Admitting: Internal Medicine

## 2014-05-25 MED ORDER — HYDROCORTISONE 5 MG PO TABS: ORAL_TABLET | ORAL | Status: DC

## 2014-05-25 NOTE — Telephone Encounter (Signed)
Patient states that she will be running out of cortef by the Aug 24th  Please refill this dosage 2 tabs am 1 tab pm if sick take medication double dose   Pharmacy: [facility] [address]   Thank you

## 2014-05-25 NOTE — Telephone Encounter (Signed)
rx sent

## 2014-06-20 ENCOUNTER — Other Ambulatory Visit: Payer: Self-pay

## 2014-06-20 MED ORDER — MONTELUKAST SODIUM 10 MG PO TABS: 10.0000 mg | ORAL_TABLET | Freq: Every day | ORAL | Status: DC

## 2014-06-20 MED ORDER — BUPROPION HCL ER (XL) 300 MG PO TB24: 300.0000 mg | ORAL_TABLET | Freq: Every day | ORAL | Status: DC

## 2014-06-20 NOTE — Telephone Encounter (Signed)
Former patient of Dr Linda Hedges No future appointments 12/26/13 last seen by Dr Linda Hedges 03/22/14 labs .Marland Kitchen..please advise, thanks

## 2014-06-20 NOTE — Telephone Encounter (Signed)
1 month of each

## 2014-06-22 ENCOUNTER — Other Ambulatory Visit: Payer: Self-pay

## 2014-06-22 MED ORDER — ZOLPIDEM TARTRATE 10 MG PO TABS: 10.0000 mg | ORAL_TABLET | Freq: Every day | ORAL | Status: DC

## 2014-06-26 ENCOUNTER — Other Ambulatory Visit: Payer: Self-pay

## 2014-06-26 NOTE — Telephone Encounter (Signed)
Former patient of Dr Linda Hedges No current up coming appointment with physicians here

## 2014-06-27 MED ORDER — ALPRAZOLAM 1 MG PO TABS: 1.0000 mg | ORAL_TABLET | Freq: Every evening | ORAL | Status: DC | PRN

## 2014-06-27 NOTE — Telephone Encounter (Signed)
Alprazolam has been called to pharmacy  

## 2014-06-27 NOTE — Telephone Encounter (Signed)
sch OV w/ne MD pls

## 2014-07-03 ENCOUNTER — Ambulatory Visit (INDEPENDENT_AMBULATORY_CARE_PROVIDER_SITE_OTHER): Payer: 59 | Admitting: Internal Medicine

## 2014-07-03 ENCOUNTER — Encounter: Payer: Self-pay | Admitting: Internal Medicine

## 2014-07-03 VITALS — BP 100/78 | HR 76 | Temp 98.5°F | Resp 16 | Wt 187.0 lb

## 2014-07-03 DIAGNOSIS — E2749 Other adrenocortical insufficiency: Secondary | ICD-10-CM

## 2014-07-03 NOTE — Progress Notes (Signed)
Patient ID: Kathleen Sullivan, female   DOB: 1976-06-07, 38 y.o.   MRN: [id number]  Kathleen Sullivan is a 37 y.o. woman initially referred by PCP, Dr Linda Hedges, for evaluation of low body temperature. During investigation for this problem, she was found to have a low cortisol and ACTH, indicative of central (secondary) adrenal insufficiency.  She started to feel much better after starting hydrocortisone but in time, she restarted to fell fatigued (especially in the pm) and is also bothered by low libido.  Reviewed history: Pt described that she started to have a low body temperature in the last 2 years. She was feeling that she had a fever when her body temp was ~75F. She had cold sweats.   She also noticed that: - hypomania - worse - decreased memory/concentration - had acne and hirsutism >> has been investigated by ObGyn (Dr. 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 I initially thought that this could cause her low body temperature however, a cortisol level returned low. A repeat 8 AM cortisol was also low, and associated with a low ACTH, indicative of secondary adrenal insufficiency:  Component     Latest Ref Rng 02/21/2014 03/03/2014  Cortisol - AM     4.3 - 22.4 ug/dL 1.3 (L) 3.2 (L)  C206 ACTH     6 - 50 pg/mL  8  She denied recent steroid use. She had one cortisone inj in the knee - 8 years ago. She took Percocet (for falling from a tree), now finished. She was not taking it before the cortisol test. She was off the steroid nasal spray long before the test.  We started hydrocortisone 10 mg in a.m. And 5 mg in p.m. Takes  the first dose ~8 am and the second dose at 6 pm and sometimes later when she forgets it.  Her temperature has increased a little. Her mental fog was better initially, now returning in pm.  No h/o hypothyroidism:  Lab Results  Component Value Date   TSH 1.51 02/21/2014   TSH 1.15 03/16/2009   FREET4 0.84 02/21/2014   The rest of the pituitary w/u was negative: Component     Latest Ref Rng 03/22/2014  Estradiol, Free      2.76  Estradiol      138  Results received      03/29/14  FSH      4.7  LH      5.35  Prolactin      15.7  Somatomedin (IGF-I)     66 - 336 ng/mL 121  Growth Hormone     0.00 - 8.00 ng/mL <0.10   A pituitary MRI (04/18/2014) showed no pituitary or brain pathology   Review of symptoms: Constitutional:no weight gain/weight loss, + pm fatigue, no subjective hyperthermia or hypothermia, + hot flushes Eyes: + blurry vision, no xerophthalmia ENT: + sore throat, no nodules palpated in throat, dysphagia/no odynophagia, + hoarseness Cardiovascular: no CP/+ SOB/nopalpitations/leg swelling Respiratory: no cough/+ SOB Gastrointestinal: no N/V/D/C/+ heartburn Musculoskeletal: + all: muscle/joint aches Skin:+ rash, + excessive hair growth Neurological: no  tremors/numbness/tingling/dizziness, + HA + low libido  I reviewed pt's medications, allergies, PMH, social hx, family hx and no changes required, except as mentioned above.  Physical exam: BP 100/78  Pulse 76  Temp(Src) 98.5 F (36.9 C) (Oral)  Resp 16  Wt 187 lb (84.823 kg) Body mass index is 37.75 kg/(m^2).  Wt Readings from Last 3 Encounters:  07/03/14 187 lb (84.823 kg)  03/22/14 180 lb (81.647 kg)  02/26/14 177 lb (80.287 kg)   Constitutional: obese, in NAD Eyes: PERRLA, EOMI, no exophthalmos ENT: moist mucous membranes, no thyromegaly, no cervical lymphadenopathy Cardiovascular: RRR, No MRG Respiratory: CTA B Gastrointestinal: abdomen soft, NT, ND, BS+ Musculoskeletal: no deformities, strength  intact in all 4 Skin: moist, warm, no rashes Neurological: no mild tremor with outstretched hands, DTR normal in all 4  Assessment: 1. 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 - I advised her to try to move the second dose of HC earlier - best around 3 pm, or, she can also try to split it tid rather than bid. Alternatively, we can increase the dose to 20 mg daily - see below: Patient Instructions  Please move the the 5 mg of Hydrocortisone to 3 pm. If this does not help your energy >> you can increase the hydrocortisone to 10 mg 2x a day or 12.5 mg in am and 7.5 mg in pm. Please come back for a follow-up appointment in 6 months. Please obtain your medalert bracelet. - We discussed about sick day rules: - the absolute need to take this medication every day and not skip doses. - to double the dose if has a fever, for the duration of the fever. - If cannot take anything by mouth (vomiting) or has severe diarrhea, to make sure that you inject the hydrocortisone in the muscle instead  - I advised her to get a medic alert bracelet mentioning adrenal insufficiency - I will see her back in 6 months

## 2014-07-03 NOTE — Patient Instructions (Signed)
Please move the the 5 mg of Hydrocortisone to 3 pm. If this does not help your energy >> you can increase the hydrocortisone to 10 mg 2x a day or 12.5 mg in am and 7.5 mg in pm.  Please come back for a follow-up appointment in 6 months.  Please obtain your medalert bracelet.

## 2014-07-21 ENCOUNTER — Ambulatory Visit (INDEPENDENT_AMBULATORY_CARE_PROVIDER_SITE_OTHER): Payer: 59 | Admitting: Internal Medicine

## 2014-07-21 ENCOUNTER — Other Ambulatory Visit: Payer: Self-pay

## 2014-07-21 ENCOUNTER — Encounter: Payer: Self-pay | Admitting: Internal Medicine

## 2014-07-21 VITALS — BP 126/68 | HR 82 | Temp 98.4°F | Resp 16 | Ht 59.0 in | Wt 184.0 lb

## 2014-07-21 DIAGNOSIS — Z23 Encounter for immunization: Secondary | ICD-10-CM

## 2014-07-21 DIAGNOSIS — Z0000 Encounter for general adult medical examination without abnormal findings: Secondary | ICD-10-CM

## 2014-07-21 DIAGNOSIS — E2749 Other adrenocortical insufficiency: Secondary | ICD-10-CM

## 2014-07-21 DIAGNOSIS — F3112 Bipolar disorder, current episode manic without psychotic features, moderate: Secondary | ICD-10-CM

## 2014-07-21 DIAGNOSIS — G894 Chronic pain syndrome: Secondary | ICD-10-CM

## 2014-07-21 DIAGNOSIS — G479 Sleep disorder, unspecified: Secondary | ICD-10-CM

## 2014-07-21 DIAGNOSIS — I340 Nonrheumatic mitral (valve) insufficiency: Secondary | ICD-10-CM

## 2014-07-21 DIAGNOSIS — K219 Gastro-esophageal reflux disease without esophagitis: Secondary | ICD-10-CM

## 2014-07-21 MED ORDER — SUVOREXANT 10 MG PO TABS: 10.0000 mg | ORAL_TABLET | Freq: Every evening | ORAL | Status: DC | PRN

## 2014-07-21 MED ORDER — ALPRAZOLAM 1 MG PO TABS: 1.0000 mg | ORAL_TABLET | Freq: Every evening | ORAL | Status: DC | PRN

## 2014-07-21 NOTE — Patient Instructions (Addendum)
We will have you try belsomra for your anxiety. Take 1 pill about 30 minutes before you go to bed. We would like you to stop using ambien. We would like you to cut back to 1 xanax in the evening for the next 3 weeks. If you are doing well with that you can cut back to 1/2 pill for the next several weeks then stop altogether if you are able.   I have given you some information on sleep hygiene which I think may be helpful. I think an alternative to the xanax for the panic may be trying some therapy to discuss your fears and try to find a way to use relaxation techniques at night time to help you sleep better.   Come back in about 3-6 months to check on how you are doing with your panic but you can call us sooner if you are having problems.

## 2014-07-21 NOTE — Assessment & Plan Note (Signed)
Will stop ambien as she is also taking xanax at night time and this is not a favorable combination. Will trial belsomra.

## 2014-07-21 NOTE — Assessment & Plan Note (Signed)
Unclear what her mood disorder is but she is currently on wellbutrin and taking benzos regularly. This is not well controlling her mood problems and still has significant anxiety at night time. Will attempt to wean from benzos and may need to add additional agent for her for mood.

## 2014-07-21 NOTE — Progress Notes (Signed)
   Subjective:    Patient ID: Kathleen Sullivan, female    DOB: 01/24/1976, 38 y.o.   MRN: [id number]  HPI The patient is a 38 YO female who has PMH panic, adrenal insuficiency, allergies, possible mental disorder. She has a lot of difficulties with pain and with sleep. She does see neurology and pain clinic for her pain. They are managing things okay but she is not getting relief from her xanax and ambien at night time for sleep. She will take xanax in the late afternoon and ambien prior to sleep. She usually goes to bed around 5AM after her children at school and sleeps until she picks them up around 2PM. She does try to sleep at night but is unable. She denies chest pains, SOB, abdominal pain. She is taking her hydrocortisone TID instead of 2 pills in the morning and 1 pill in the morning.   Review of Systems  Constitutional: Negative for fever, activity change, appetite change, fatigue and unexpected weight change.  HENT: Negative.   Eyes: Negative.   Respiratory: Negative for cough, chest tightness, shortness of breath and wheezing.   Cardiovascular: Negative for chest pain, palpitations and leg swelling.  Gastrointestinal: Negative for abdominal pain, diarrhea, constipation and abdominal distention.  Musculoskeletal: Negative for arthralgias, gait problem and myalgias.  Skin: Negative.   Neurological: Negative for dizziness, weakness, light-headedness and numbness.  Psychiatric/Behavioral: Positive for sleep disturbance. Negative for suicidal ideas and self-injury. The patient is nervous/anxious and is hyperactive.       Objective:   Physical Exam  Constitutional: She is oriented to person, place, and time. She appears well-developed and well-nourished.  Obese  HENT:  Head: Normocephalic and atraumatic.  Eyes: EOM are normal.  Neck: Normal range of motion.  Cardiovascular: Normal rate and regular rhythm.   No murmur heard. Pulmonary/Chest: Effort normal and breath sounds normal.    Abdominal: Soft. Bowel sounds are normal.  Neurological: She is alert and oriented to person, place, and time. Coordination normal.  Skin: Skin is warm and dry.   Filed Vitals:   07/21/14 1332  BP: 126/68  Pulse: 82  Temp: 98.4 F (36.9 C)  TempSrc: Oral  Resp: 16  Height: 4' 11"  (1.499 m)  Weight: 184 lb (83.462 kg)  SpO2: 98%      Assessment & Plan:  Flu shot given today

## 2014-07-21 NOTE — Assessment & Plan Note (Signed)
Flu shot today 

## 2014-07-21 NOTE — Progress Notes (Signed)
Pre visit review using our clinic review tool, if applicable. No additional management support is needed unless otherwise documented below in the visit note. 

## 2014-07-21 NOTE — Assessment & Plan Note (Signed)
Does well with omeprazole but does not remember to take most of the time.

## 2014-07-21 NOTE — Assessment & Plan Note (Signed)
She is now taking her hydrocortisone TID instead of 10 mg qam and 5 mg qpm. She will follow up with endocrinology.

## 2014-07-27 ENCOUNTER — Telehealth: Payer: Self-pay | Admitting: *Deleted

## 2014-07-27 NOTE — Telephone Encounter (Signed)
Left msg on triage stating saw md last Friday was rx new sleeping med belsorma. Pharmacy stated that she needed a prior authorization requesting status on PA...Johny Chess

## 2014-07-28 ENCOUNTER — Other Ambulatory Visit: Payer: Self-pay | Admitting: Internal Medicine

## 2014-07-28 NOTE — Telephone Encounter (Signed)
Patient called in on this.  She does not want to be out over the weekend since PA has not processed through insurance.

## 2014-07-28 NOTE — Telephone Encounter (Signed)
Can you call in please?

## 2014-07-31 ENCOUNTER — Other Ambulatory Visit: Payer: Self-pay | Admitting: Internal Medicine

## 2014-08-03 ENCOUNTER — Telehealth: Payer: Self-pay | Admitting: Internal Medicine

## 2014-08-03 NOTE — Telephone Encounter (Signed)
Patient states she is waiting on PA for belsomnra. Please adivse pt on status.

## 2014-08-04 ENCOUNTER — Other Ambulatory Visit: Payer: Self-pay | Admitting: Internal Medicine

## 2014-08-08 ENCOUNTER — Other Ambulatory Visit: Payer: Self-pay | Admitting: Internal Medicine

## 2014-08-09 ENCOUNTER — Other Ambulatory Visit: Payer: Self-pay | Admitting: Geriatric Medicine

## 2014-08-09 MED ORDER — BUPROPION HCL ER (XL) 300 MG PO TB24: 300.0000 mg | ORAL_TABLET | Freq: Every day | ORAL | Status: DC

## 2014-08-09 NOTE — Telephone Encounter (Signed)
Request submitted for PA. I tried to call patient just to let her know what's going on. No answer.

## 2014-08-10 ENCOUNTER — Other Ambulatory Visit: Payer: Self-pay | Admitting: Geriatric Medicine

## 2014-08-10 MED ORDER — BUPROPION HCL ER (XL) 300 MG PO TB24: 300.0000 mg | ORAL_TABLET | Freq: Every day | ORAL | Status: DC

## 2014-08-22 ENCOUNTER — Other Ambulatory Visit: Payer: Self-pay | Admitting: Internal Medicine

## 2014-08-25 ENCOUNTER — Ambulatory Visit (INDEPENDENT_AMBULATORY_CARE_PROVIDER_SITE_OTHER): Payer: 59 | Admitting: Cardiology

## 2014-08-25 ENCOUNTER — Encounter: Payer: Self-pay | Admitting: *Deleted

## 2014-08-25 VITALS — BP 116/70 | HR 90 | Ht 59.0 in | Wt 178.0 lb

## 2014-08-25 DIAGNOSIS — I071 Rheumatic tricuspid insufficiency: Secondary | ICD-10-CM

## 2014-08-25 DIAGNOSIS — I493 Ventricular premature depolarization: Secondary | ICD-10-CM

## 2014-08-25 DIAGNOSIS — I340 Nonrheumatic mitral (valve) insufficiency: Secondary | ICD-10-CM

## 2014-08-25 DIAGNOSIS — R002 Palpitations: Secondary | ICD-10-CM

## 2014-08-25 NOTE — Progress Notes (Signed)
Patient ID: EMMALEA TREANOR, female   DOB: 1976/04/13, 38 y.o.   MRN: [id number]    Patient Name: Kathleen Sullivan Date of Encounter: 08/25/2014  Primary Care Provider:  Olga Millers, MD Primary Cardiologist:  Dorothy Spark  Problem List   Past Medical History  Diagnosis Date  . Personal history of unspecified circulatory disease   . Hallux rigidus   . Condyloma acuminatum   . Unspecified chronic bronchitis   . Allergy, unspecified not elsewhere classified   . Headache(784.0)   . Esophageal reflux   . Unspecified asthma(493.90)   . Panic disorder without agoraphobia   . Other malignant neoplasm without specification of site     skin cancer  . Mild mitral regurgitation   . Mild tricuspid regurgitation   . Other specified personal history presenting hazards to health(V15.89)     atypical hx of chest pain   Past Surgical History  Procedure Laterality Date  . Finger surgery    . Cesarean section    . Allergies     Allergies  Allergies  Allergen Reactions  . Metoclopramide Hcl Other (See Comments)    "uncontrolled jerking of the mouth"   HPI  The patient is a 38 YO female who has PMH panic, recently diagnosed adrenal insuficiency, started on steroids, was previously followed by Dr. Stanford Breed for mitral and tricuspid insufficiency and palpitations. She had echocardiogram performed in 2008 that showed normal biventricular size and function and only mild mitral and tricuspid regurgitation. She was tested for palpitations that turned out to be frequent PVCs. She was not interested in being started on beta blockers. In the meantime she was diagnosed with adrenal insufficiency or started on steroids. She also gained significant amount of weight and stopping physically active as she hurt her foot. She denies any syncope. She describes her palpitations a strong beats followed by a positive on she gets them every day and they're very uncomfortable. She denies any chest  pain or dyspnea on exertion.  Home Medications  Prior to Admission medications   Medication Sig Start Date End Date Taking? Authorizing Provider  albuterol (PROAIR HFA) 108 (90 BASE) MCG/ACT inhaler Inhale 2 puffs into the lungs 4 (four) times daily. 12/29/13  Yes Neena Rhymes, MD  ALPRAZolam Duanne Moron) 1 MG tablet Take 1 tablet (1 mg total) by mouth at bedtime as needed for anxiety (or panic attack). 07/21/14  Yes Olga Millers, MD  buPROPion (WELLBUTRIN XL) 300 MG 24 hr tablet Take 1 tablet (300 mg total) by mouth daily. 08/10/14  Yes Olga Millers, MD  fexofenadine (ALLEGRA) 180 MG tablet Take 180 mg by mouth daily.   Yes Historical Provider, MD  gabapentin (NEURONTIN) 300 MG capsule Take 300 mg by mouth 2 (two) times daily.   Yes Historical Provider, MD  hydrocortisone (CORTEF) 5 MG tablet Take 2 tablets in am and 1 tablet in pm no later than 6 pm 05/25/14  Yes Philemon Kingdom, MD  hydrocortisone sodium succinate (SOLU-CORTEF) 100 MG SOLR injection Inject in the muscle 100 mg as needed if you cannot take the hydrocortisone by mouth 03/22/14  Yes Philemon Kingdom, MD  Melatonin 10 MG TABS Take 1 tablet by mouth at bedtime.   Yes Historical Provider, MD  montelukast (SINGULAIR) 10 MG tablet Take 1 tablet (10 mg total) by mouth at bedtime. 06/20/14  Yes Hendricks Limes, MD  omeprazole (PRILOSEC) 20 MG capsule Take 1 capsule (20 mg total) by mouth daily. 12/23/12  Yes  Neena Rhymes, MD  Probiotic Product (PROBIOTIC DAILY) CAPS Take 1 capsule by mouth daily.   Yes Historical Provider, MD  pseudoephedrine (SUDAFED) 60 MG tablet Take 60 mg by mouth every 4 (four) hours as needed for congestion.   Yes Historical Provider, MD  Suvorexant (BELSOMRA) 10 MG TABS Take 10 mg by mouth at bedtime as needed (sleep). 07/21/14  Yes Olga Millers, MD  SYRINGE-NEEDLE, DISP, 3 ML (B-D SYRINGE/NEEDLE 3CC/25GX5/8) 25G X 5/8" 3 ML MISC Use as needed 03/22/14  Yes Philemon Kingdom, MD  tretinoin  (RETIN-A) 0.1 % cream Apply topically at bedtime. 12/26/13  Yes Neena Rhymes, MD  Water For Inject Bact Parabens (STERILE WATER, BACTERIOSTATIC,) SOLN injection Please dissolve the hydrocortisone solution and inject 100 mg in the muscle as needed 03/22/14  Yes Philemon Kingdom, MD    Family History  Family History  Problem Relation Age of Onset  . Breast cancer Mother   . Uterine cancer Mother   . Mental illness Mother   . Colon cancer Neg Hx   . Diabetes Brother   . Breast cancer Other   . Coronary artery disease Other   . Stroke Other   . Diabetes Other   . Hypertension Other   . Anxiety disorder Mother     Social History  History   Social History  . Marital Status: Married    Spouse Name: N/A    Number of Children: 1  . Years of Education: 13   Occupational History  . pharmacy tech   . student     nursing   Social History Main Topics  . Smoking status: Former Research scientist (life sciences)  . Smokeless tobacco: Never Used     Comment: >15 yrs ago.  . Alcohol Use: Yes  . Drug Use: No  . Sexual Activity:    Partners: Male   Other Topics Concern  . 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 Married '01, 1 dtr - '05. 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  Cardiovascular:  No chest pain, dyspnea on exertion, edema, orthopnea, palpitations, paroxysmal nocturnal dyspnea. Dermatological: No rash, lesions/masses Respiratory: No cough, dyspnea Urologic: No hematuria, dysuria Abdominal:   No nausea, vomiting, diarrhea, bright red blood per rectum, melena, or hematemesis Neurologic:  No visual changes, wkns, changes in mental status. All other systems reviewed and are otherwise negative except as noted above.  Physical Exam  Blood pressure 116/70, pulse 90, height 4' 11"  (1.499 m), weight 178 lb (80.74 kg).  General: Pleasant,  NAD Psych: Normal affect. Neuro: Alert and oriented X 3. Moves all extremities spontaneously. HEENT: Normal  Neck: Supple without bruits or JVD. Lungs:  Resp regular and unlabored, CTA. Heart: RRR no s3, s4, 2/6 systolic murmur.. Abdomen: Soft, non-tender, non-distended, BS + x 4.  Extremities: No clubbing, cyanosis or edema. DP/PT/Radials 2+ and equal bilaterally.  Labs:  No results for input(s): CKTOTAL, CKMB, TROPONINI in the last 72 hours. Lab Results  Component Value Date   WBC 10.9* 02/07/2014   HGB 14.6 02/07/2014   HCT 43.0 02/07/2014   MCV 91.3 02/07/2014   PLT 250.0 02/07/2014    No results found for: DDIMER Invalid input(s): POCBNP    Component Value Date/Time   NA 138 02/03/2012 0335   K 3.7 02/03/2012 0335   CL 105  02/03/2012 0335   CO2 27 02/03/2012 0335   GLUCOSE 100* 02/03/2012 0335   BUN 6 02/03/2012 0335   CREATININE 0.71 02/03/2012 0335   CALCIUM 8.5 02/03/2012 0335   PROT 7.2 08/24/2009 1144   ALBUMIN 4.4 08/24/2009 1144   AST 19 08/24/2009 1144   ALT 18 08/24/2009 1144   ALKPHOS 38* 08/24/2009 1144   BILITOT 0.6 08/24/2009 1144   GFRNONAA >90 02/03/2012 0335   GFRAA >90 02/03/2012 0335   Lab Results  Component Value Date   CHOL 200 03/16/2009   HDL 58.00 03/16/2009   LDLCALC 127* 03/16/2009   TRIG 75.0 03/16/2009    Accessory Clinical Findings  Echocardiogram - the patient brings in old record from 2008 that shows normal biventricular size and function, mild mitral and tricuspid regurgitation. Mild left atrial enlargement.  ECG - normal sinus rhythm normal EKG.    Assessment & Plan  38 year old female  1. 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 Last echo performed 7 years ago we will repeat.  2. Palpitations - frequent PVCs and prior Holter, slightly worse from the last visit in 2008 she's not interested in being started on beta blockers as she is deferring that they would make her even more tired. She  doesn't have any pre-syncope or syncope. Therefore will just follow for now and start any medication if her symptoms get worse.  3. Obesity - advised to start exercising that would also help with her palpitations.  Follow-up in one year.  Dorothy Spark, MD, [facility] 08/25/2014, 3:35 PM

## 2014-08-25 NOTE — Patient Instructions (Signed)
Your physician recommends that you continue on your current medications as directed. Please refer to the Current Medication list given to you today.   Your physician has requested that you have an echocardiogram. Echocardiography is a painless test that uses sound waves to create images of your heart. It provides your doctor with information about the size and shape of your heart and how well your heart's chambers and valves are working. This procedure takes approximately one hour. There are no restrictions for this procedure.    Your physician wants you to follow-up in: [place] will receive a reminder letter in the mail two months in advance. If you don't receive a letter, please call our office to schedule the follow-up appointment.

## 2014-08-28 ENCOUNTER — Other Ambulatory Visit: Payer: Self-pay | Admitting: Geriatric Medicine

## 2014-08-28 MED ORDER — ALPRAZOLAM 1 MG PO TABS: 1.0000 mg | ORAL_TABLET | Freq: Every evening | ORAL | Status: DC | PRN

## 2014-09-01 ENCOUNTER — Other Ambulatory Visit: Payer: Self-pay | Admitting: Internal Medicine

## 2014-09-06 ENCOUNTER — Ambulatory Visit (HOSPITAL_COMMUNITY): Payer: 59 | Attending: Cardiology | Admitting: Radiology

## 2014-09-06 DIAGNOSIS — I071 Rheumatic tricuspid insufficiency: Secondary | ICD-10-CM | POA: Diagnosis not present

## 2014-09-06 DIAGNOSIS — I340 Nonrheumatic mitral (valve) insufficiency: Secondary | ICD-10-CM | POA: Insufficient documentation

## 2014-09-06 DIAGNOSIS — E669 Obesity, unspecified: Secondary | ICD-10-CM | POA: Diagnosis not present

## 2014-09-06 NOTE — Progress Notes (Signed)
Echocardiogram performed.  

## 2014-09-11 ENCOUNTER — Telehealth: Payer: Self-pay | Admitting: Internal Medicine

## 2014-09-11 NOTE — Telephone Encounter (Signed)
Pt request to increase Belsomra, pt stated she like this med but need it to be a little stronger.

## 2014-09-12 ENCOUNTER — Other Ambulatory Visit: Payer: Self-pay | Admitting: Geriatric Medicine

## 2014-09-12 MED ORDER — SUVOREXANT 10 MG PO TABS: 10.0000 mg | ORAL_TABLET | Freq: Every evening | ORAL | Status: DC | PRN

## 2014-09-12 NOTE — Telephone Encounter (Signed)
We can certainly try the higher dosage. However since she needed prior authorization to get the belsomra we may need a new prior authorization when we change the dosage. It would appear that her medication was just recently filled so we will send in for the order and try for the belsomra starting next month.

## 2014-09-13 ENCOUNTER — Other Ambulatory Visit: Payer: Self-pay | Admitting: Internal Medicine

## 2014-09-18 ENCOUNTER — Other Ambulatory Visit: Payer: Self-pay | Admitting: Internal Medicine

## 2014-10-04 ENCOUNTER — Other Ambulatory Visit: Payer: Self-pay | Admitting: Internal Medicine

## 2014-10-09 ENCOUNTER — Other Ambulatory Visit: Payer: Self-pay | Admitting: Internal Medicine

## 2014-10-11 NOTE — Telephone Encounter (Signed)
Pt called in and needs refills on her   Singular  Belsomra

## 2014-10-12 ENCOUNTER — Other Ambulatory Visit: Payer: Self-pay | Admitting: Geriatric Medicine

## 2014-10-12 MED ORDER — SUVOREXANT 10 MG PO TABS: 10.0000 mg | ORAL_TABLET | Freq: Every evening | ORAL | Status: DC | PRN

## 2014-10-12 NOTE — Telephone Encounter (Signed)
Printed and waiting for signature.

## 2014-10-12 NOTE — Telephone Encounter (Signed)
Faxed to pharmacy

## 2014-11-01 ENCOUNTER — Telehealth: Payer: Self-pay | Admitting: Internal Medicine

## 2014-11-08 ENCOUNTER — Telehealth: Payer: Self-pay | Admitting: Internal Medicine

## 2014-11-10 IMAGING — CR DG THORACIC SPINE 2V
3 series · 3 of 3 positions shown · non-contrast
Comparison: CT chest 09/20/2004.  Chest x-ray 02/03/2012

CLINICAL DATA: Fell from a tree.  Left-sided chest pain.

EXAM:
THORACIC SPINE - 2 VIEW

[w thoracic swimmers]
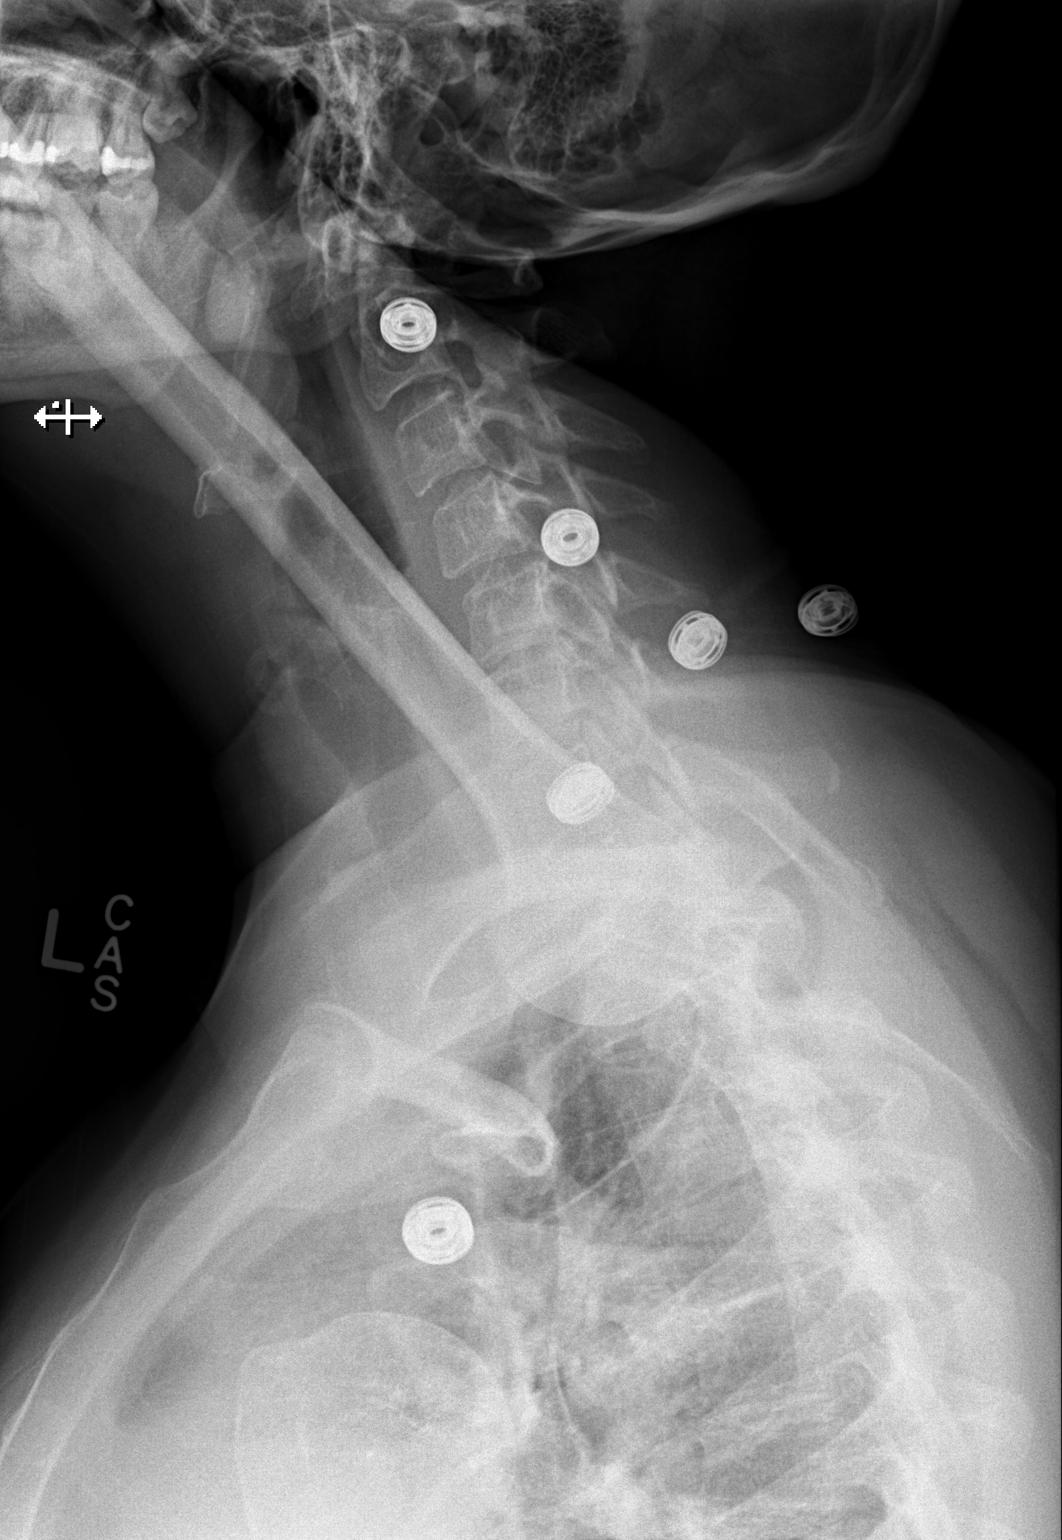

[t thoracic spine ap]
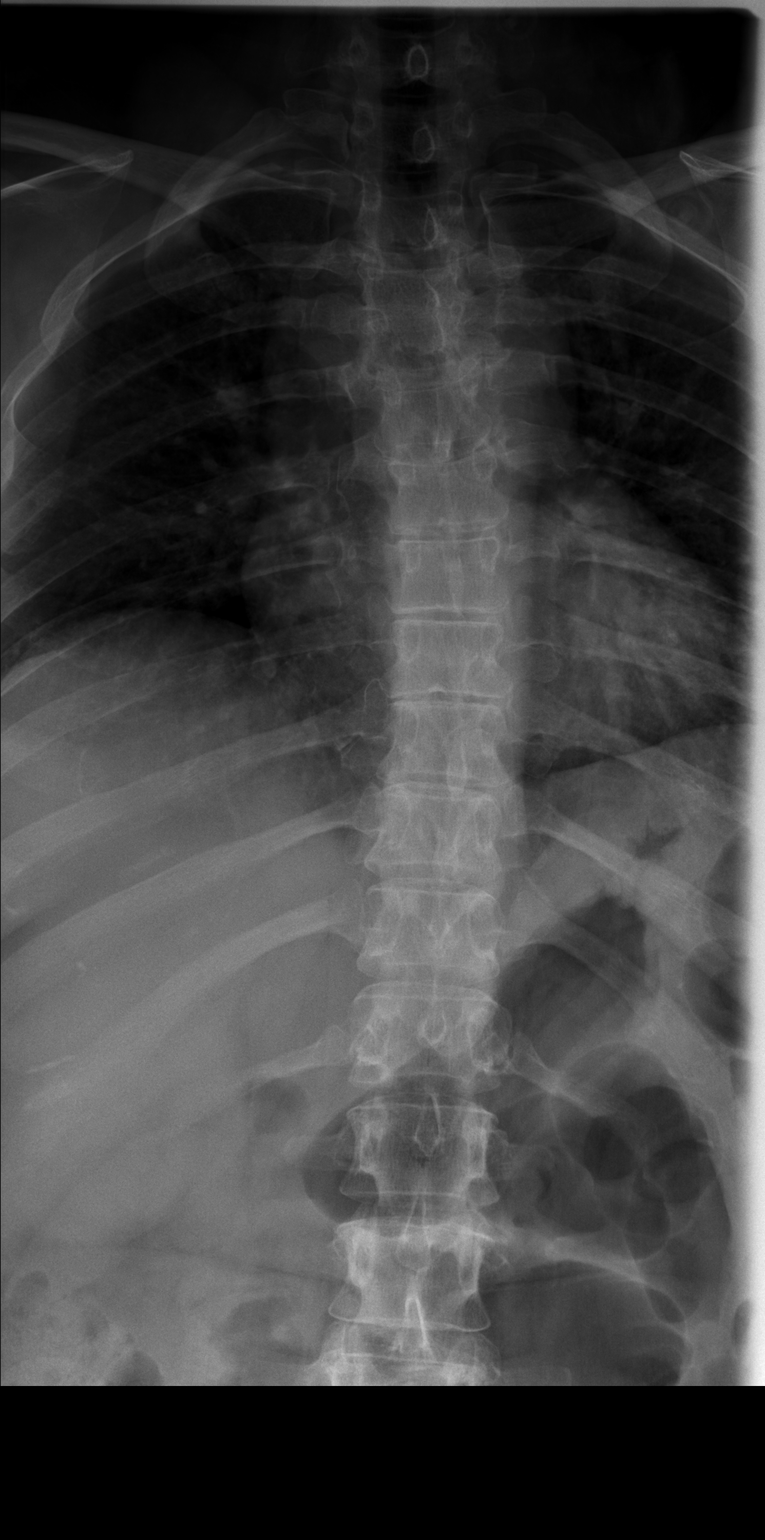

[t thoracic spine lat]
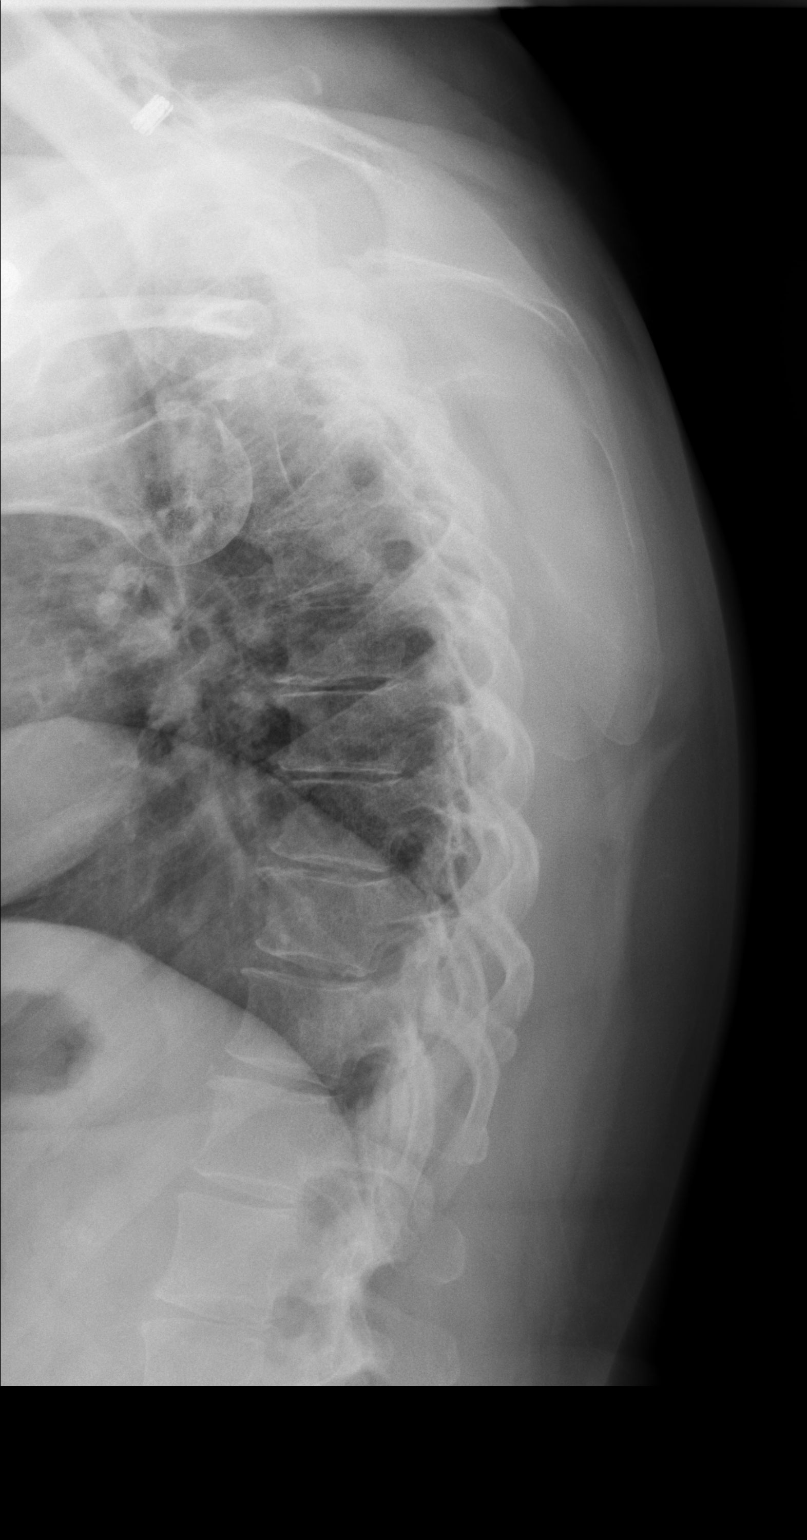

[3 of 3 positions shown; findings below may reference images not displayed]

FINDINGS: There is slight scoliotic deformity convex right in the upper
thoracic region. This curvature has a similar appearance to prior CT
and prior 7107 chest radiograph; therefore it is unlikely that there
is a fracture in the upper thoracic region. Nevertheless this area
is poorly seen on swimmer's view. If patient is point tender in the
mid to upper thoracic spine, CT scanning may be warranted.

No gross compression deformity or traumatic subluxation. No obvious
paravertebral hematoma.
IMPRESSION: Slight scoliotic deformity convex right upper thoracic region
appears to be chronic. Correlate clinically however with areas of
pain.

## 2014-11-10 MED ORDER — MONTELUKAST SODIUM 10 MG PO TABS: 10.0000 mg | ORAL_TABLET | Freq: Every day | ORAL | Status: DC

## 2014-11-10 NOTE — Telephone Encounter (Signed)
The xanax was while starting the belsomra and not to be taken concurrently. Will print and fax belsomra today. Unfortunately this is a controlled substance and cannot be sent electronically so I cannot control when her pharmacy receives and processes our fax. Thank you for the information.

## 2014-11-10 NOTE — Telephone Encounter (Signed)
Pt called in again this month. I had the same conversation with her in [place] about her meds.  She is complaining about her meds not getting filled in a timely manner.  She states that each month her meds should be at the pharmacy waiting on her.  She said that she knows that she can get them filled 3 to 5 days before they are due.  She said if this continues she will have to find another dr that will fill her meds like they need to.  She wants her Marva Panda and her Singulair sent to pharmacy.    Dr Doug Sou  Just wanted to let you know what this patient is saying.

## 2014-11-13 ENCOUNTER — Other Ambulatory Visit: Payer: Self-pay | Admitting: Geriatric Medicine

## 2014-11-13 MED ORDER — SUVOREXANT 10 MG PO TABS: 10.0000 mg | ORAL_TABLET | Freq: Every day | ORAL | Status: DC

## 2014-11-13 NOTE — Telephone Encounter (Signed)
Patient states she has had two panic attacks this weekend.  She did not know she was not to be taking xanax.  She does not understand.  She would like a call back in regards.

## 2014-11-14 ENCOUNTER — Other Ambulatory Visit: Payer: Self-pay | Admitting: Internal Medicine

## 2014-11-14 MED ORDER — ALPRAZOLAM 0.5 MG PO TABS: 0.5000 mg | ORAL_TABLET | Freq: Every day | ORAL | Status: DC | PRN

## 2014-11-14 NOTE — Telephone Encounter (Signed)
Left message for patient to call me back to discuss taking alprazolam.

## 2014-11-14 NOTE — Telephone Encounter (Signed)
The last time we talked about it she stated was taking for sleep. Since she is now on belsomra for sleep assumed she would not need it. If she is still having panic and anxiety then that needs to be re-addressed. Will refill for #30 no refills.

## 2014-11-22 ENCOUNTER — Other Ambulatory Visit: Payer: Self-pay | Admitting: Internal Medicine

## 2014-12-11 ENCOUNTER — Other Ambulatory Visit: Payer: Self-pay | Admitting: Internal Medicine

## 2014-12-12 ENCOUNTER — Other Ambulatory Visit: Payer: Self-pay | Admitting: Geriatric Medicine

## 2014-12-12 MED ORDER — ALPRAZOLAM 0.5 MG PO TABS: 0.5000 mg | ORAL_TABLET | Freq: Every day | ORAL | Status: DC | PRN

## 2015-01-01 ENCOUNTER — Telehealth: Payer: Self-pay | Admitting: Internal Medicine

## 2015-01-01 ENCOUNTER — Ambulatory Visit: Payer: 59 | Admitting: Internal Medicine

## 2015-01-01 NOTE — Telephone Encounter (Signed)
Caller states she would like to reschedule the appointment for Monday for later in the week. LM with [facility]

## 2015-01-02 ENCOUNTER — Other Ambulatory Visit: Payer: Self-pay | Admitting: Internal Medicine

## 2015-01-04 NOTE — Telephone Encounter (Signed)
error 

## 2015-01-05 ENCOUNTER — Telehealth: Payer: Self-pay | Admitting: Internal Medicine

## 2015-01-05 NOTE — Telephone Encounter (Signed)
Pt called in and needs ins referral sent to [facility]

## 2015-01-11 NOTE — Telephone Encounter (Signed)
Pt must have PCP changed with [employer]. Left message for patient to call office.

## 2015-01-12 NOTE — Telephone Encounter (Signed)
Left message for patient to call office.  

## 2015-01-12 NOTE — Telephone Encounter (Signed)
Dr. Gaetano Net routine gyn exam  Also needs referral for derm - Dr. Elvera Lennox hx of skin cancer

## 2015-01-16 NOTE — Telephone Encounter (Signed)
Still waiting on patient to change PCP.

## 2015-01-22 NOTE — Telephone Encounter (Signed)
PCP still not updated.

## 2015-02-01 NOTE — Telephone Encounter (Signed)
PCP still not updated.

## 2015-02-06 ENCOUNTER — Other Ambulatory Visit: Payer: Self-pay | Admitting: Internal Medicine

## 2015-02-06 MED ORDER — ALPRAZOLAM 0.5 MG PO TABS: ORAL_TABLET | ORAL | Status: DC

## 2015-03-07 ENCOUNTER — Telehealth: Payer: Self-pay | Admitting: Internal Medicine

## 2015-03-09 NOTE — Telephone Encounter (Signed)
Done

## 2015-03-09 NOTE — Telephone Encounter (Signed)
Patient need a refill of Hydrocortisone, she has one pill left, can it be called in today, send to [place], [place] [phone] (Phone) [phone] (Fax)

## 2015-04-04 ENCOUNTER — Other Ambulatory Visit: Payer: Self-pay | Admitting: Internal Medicine

## 2015-04-10 NOTE — Telephone Encounter (Signed)
Dr. Gaetano Net Ref #  [id number] valid 04/10/15-10/11/15 for 6 visits

## 2015-04-10 NOTE — Telephone Encounter (Signed)
Also needs referral for Dr. Hardin Negus dx chronic pain

## 2015-04-10 NOTE — Telephone Encounter (Signed)
Dr. Elvera Lennox - Ref # [id number] valid 04/10/15-10/11/15 for 6 visits

## 2015-04-10 NOTE — Telephone Encounter (Signed)
Dr. Hardin Negus - Ref # [id number] valid 04/10/15-10/11/15 for 6 visits

## 2015-04-13 ENCOUNTER — Emergency Department (INDEPENDENT_AMBULATORY_CARE_PROVIDER_SITE_OTHER): Payer: 59

## 2015-04-13 ENCOUNTER — Emergency Department (INDEPENDENT_AMBULATORY_CARE_PROVIDER_SITE_OTHER)
Admission: EM | Admit: 2015-04-13 | Discharge: 2015-04-13 | Disposition: A | Discharge status: Home / Self Care | Payer: 59 | Source: Home / Self Care

## 2015-04-13 ENCOUNTER — Encounter (HOSPITAL_COMMUNITY): Payer: Self-pay

## 2015-04-13 DIAGNOSIS — S60222A Contusion of left hand, initial encounter: Secondary | ICD-10-CM

## 2015-04-13 NOTE — ED Notes (Signed)
Reportedly fell 7-6, injury to dominat left hand; wrapped and used ice

## 2015-04-13 NOTE — Discharge Instructions (Signed)
X-rays are negative for fracture. Suggest ibuprofen for now and follow up with your doctor if symptoms don't clear within a week.

## 2015-04-13 NOTE — ED Provider Notes (Signed)
CSN: [id number]     Arrival date & time 04/13/15  1721 History   First MD Initiated Contact with Patient 04/13/15 1803     No chief complaint on file.  (Consider location/radiation/quality/duration/timing/severity/associated sxs/prior Treatment) Patient is a 39 y.o. female presenting with hand pain. The history is provided by the patient.  Hand Pain This is a new problem. The current episode started 2 days ago. The problem occurs constantly. The problem has not changed since onset.Pertinent negatives include no chest pain, no abdominal pain, no headaches and no shortness of breath. Nothing relieves the symptoms. She has tried a cold compress and a warm compress for the symptoms. The treatment provided no relief.  This is a stay at home married woman with left pinky finger MCP joint pain after falling at home trying to extricate kitty from behind home entertainment center. She is left handed  Patient has h/o Addison's and has been on hydrocortisone for a year.   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  Past Medical History  Diagnosis Date  . Personal history of unspecified circulatory disease   . Hallux rigidus   . Condyloma acuminatum   . Unspecified chronic bronchitis   . Allergy, unspecified not elsewhere classified   . Headache(784.0)   . Esophageal reflux   . Unspecified asthma(493.90)   . Panic disorder without agoraphobia   . Other malignant neoplasm without specification of site     skin cancer  . Mild mitral regurgitation   . Mild tricuspid regurgitation   . Other specified personal history presenting hazards to health(V15.89)     atypical hx of chest pain   Past Surgical History  Procedure Laterality Date  . Finger surgery    . Cesarean section    . Allergies     Family History  Problem Relation Age of Onset  . Breast cancer Mother   . Uterine cancer Mother   . Mental illness  Mother   . Colon cancer Neg Hx   . Diabetes Brother   . Breast cancer Other   . Coronary artery disease Other   . Stroke Other   . Diabetes Other   . Hypertension Other   . Anxiety disorder Mother    History  Substance Use Topics  . Smoking status: Former Research scientist (life sciences)  . Smokeless tobacco: Never Used     Comment: >15 yrs ago.  . Alcohol Use: Yes   OB History    No data available     Review of Systems  Constitutional: Negative.   HENT: Negative.   Eyes: Negative.   Respiratory: Negative.  Negative for shortness of breath.   Cardiovascular: Negative.  Negative for chest pain.  Gastrointestinal: Negative.  Negative for abdominal pain.  Musculoskeletal: Negative.        Tender left hand/pinky MCP joint tenderness.  No ecchymosis or significant swelling.  Good ROM Wrist and forearm are nontender. Hypermobile joints.  Skin: Negative.   Neurological: Negative for dizziness, facial asymmetry, weakness, light-headedness, numbness and headaches.  Psychiatric/Behavioral: Negative.     Allergies  Metoclopramide hcl  Home Medications   Prior to Admission medications   Medication Sig Start Date End Date Taking? Authorizing Provider  albuterol (PROAIR HFA) 108 (90 BASE) MCG/ACT inhaler Inhale 2 puffs into the lungs 4 (four) times daily. 12/29/13   Neena Rhymes, MD  ALPRAZolam Duanne Moron) 0.5 MG tablet TAKE 1 TABLET BY MOUTH EVERY DAY  AS NEEDED FOR ANXIETY 04/05/15   Olga Millers, MD  buPROPion (WELLBUTRIN XL) 300 MG 24 hr tablet TAKE ONE TABLET BY MOUTH ONE TIME DAILY  01/02/15   Olga Millers, MD  fexofenadine (ALLEGRA) 180 MG tablet Take 180 mg by mouth daily.    Historical Provider, MD  gabapentin (NEURONTIN) 300 MG capsule Take 300 mg by mouth 2 (two) times daily.    Historical Provider, MD  hydrocortisone (CORTEF) 5 MG tablet TAKE 2 TABLETS BY MOUTH EVERY MORNING AND 1 TABLET EVERY EVENING NO LATER THAN 6PM 03/09/15   Philemon Kingdom, MD  hydrocortisone sodium succinate  (SOLU-CORTEF) 100 MG SOLR injection Inject in the muscle 100 mg as needed if you cannot take the hydrocortisone by mouth 03/22/14   Philemon Kingdom, MD  Melatonin 10 MG TABS Take 1 tablet by mouth at bedtime.    Historical Provider, MD  montelukast (SINGULAIR) 10 MG tablet Take 1 tablet (10 mg total) by mouth at bedtime. 11/10/14   Olga Millers, MD  omeprazole (PRILOSEC) 20 MG capsule Take 1 capsule (20 mg total) by mouth daily. 12/23/12   Neena Rhymes, MD  Probiotic Product (PROBIOTIC DAILY) CAPS Take 1 capsule by mouth daily.    Historical Provider, MD  pseudoephedrine (SUDAFED) 60 MG tablet Take 60 mg by mouth every 4 (four) hours as needed for congestion.    Historical Provider, MD  Suvorexant (BELSOMRA) 10 MG TABS Take 10 mg by mouth at bedtime. 11/13/14   Olga Millers, MD  SYRINGE-NEEDLE, DISP, 3 ML (B-D SYRINGE/NEEDLE 3CC/25GX5/8) 25G X 5/8" 3 ML MISC Use as needed 03/22/14   Philemon Kingdom, MD  tretinoin (RETIN-A) 0.1 % cream Apply topically at bedtime. 12/26/13   Neena Rhymes, MD  Water For Inject Bact Parabens (STERILE WATER, BACTERIOSTATIC,) SOLN injection Please dissolve the hydrocortisone solution and inject 100 mg in the muscle as needed 03/22/14   Philemon Kingdom, MD   BP 135/97 mmHg  Pulse 88  Temp(Src) 98.8 F (37.1 C) (Oral)  Resp 16  SpO2 96% Physical Exam  Constitutional: She is oriented to person, place, and time. She appears well-developed and well-nourished.  HENT:  Head: Normocephalic and atraumatic.  Right Ear: External ear normal.  Left Ear: External ear normal.  Eyes: Conjunctivae are normal. Pupils are equal, round, and reactive to light.  Neck: Normal range of motion. Neck supple.  Pulmonary/Chest: Effort normal.  Musculoskeletal: Normal range of motion. She exhibits tenderness.  Tender left little finger MCP joint  Neurological: She is alert and oriented to person, place, and time.  Skin: Skin is warm and dry.  Psychiatric: She has a normal  mood and affect.  Nursing note and vitals reviewed.   ED Course  Procedures (including critical care time) Labs Review Labs Reviewed - No data to display  Imaging Review Preliminary reading:  Negative left hand  MDM   The findings suggest finger sprain.  Patient may use ibuprofen prn and protect the hand from heavy lifting for the next few days and symptoms should subside.  Robyn Haber, MD  Robyn Haber, MD 04/13/15 [phone]

## 2015-04-20 ENCOUNTER — Telehealth: Payer: Self-pay | Admitting: Internal Medicine

## 2015-04-20 DIAGNOSIS — H9192 Unspecified hearing loss, left ear: Secondary | ICD-10-CM

## 2015-04-20 NOTE — Telephone Encounter (Signed)
Pt called and is needing a referral for an ENT Dr.  Left ear is not draining.  Can hear out of it not matter want meds she takes it doesn't help

## 2015-04-20 NOTE — Telephone Encounter (Signed)
Done

## 2015-04-24 ENCOUNTER — Other Ambulatory Visit: Payer: Self-pay | Admitting: Family

## 2015-04-24 DIAGNOSIS — H9192 Unspecified hearing loss, left ear: Secondary | ICD-10-CM

## 2015-05-07 ENCOUNTER — Other Ambulatory Visit: Payer: Self-pay | Admitting: Internal Medicine

## 2015-05-08 ENCOUNTER — Other Ambulatory Visit: Payer: Self-pay | Admitting: Otolaryngology

## 2015-05-08 DIAGNOSIS — R51 Headache: Secondary | ICD-10-CM

## 2015-05-08 DIAGNOSIS — R0981 Nasal congestion: Secondary | ICD-10-CM

## 2015-05-08 DIAGNOSIS — R519 Headache, unspecified: Secondary | ICD-10-CM

## 2015-05-10 ENCOUNTER — Ambulatory Visit
Admission: RE | Admit: 2015-05-10 | Discharge: 2015-05-10 | Disposition: A | Discharge status: Home / Self Care | Payer: 59 | Source: Ambulatory Visit | Attending: Otolaryngology | Admitting: Otolaryngology

## 2015-05-10 DIAGNOSIS — R519 Headache, unspecified: Secondary | ICD-10-CM

## 2015-05-10 DIAGNOSIS — R51 Headache: Secondary | ICD-10-CM

## 2015-05-10 DIAGNOSIS — R0981 Nasal congestion: Secondary | ICD-10-CM

## 2015-06-05 ENCOUNTER — Encounter (HOSPITAL_BASED_OUTPATIENT_CLINIC_OR_DEPARTMENT_OTHER): Payer: Self-pay | Admitting: *Deleted

## 2015-06-06 ENCOUNTER — Other Ambulatory Visit: Payer: Self-pay | Admitting: Otolaryngology

## 2015-06-06 ENCOUNTER — Other Ambulatory Visit: Payer: Self-pay | Admitting: Geriatric Medicine

## 2015-06-06 ENCOUNTER — Other Ambulatory Visit: Payer: Self-pay | Admitting: *Deleted

## 2015-06-06 MED ORDER — HYDROCORTISONE 5 MG PO TABS: ORAL_TABLET | ORAL | Status: DC

## 2015-06-06 NOTE — H&P (Signed)
PREOPERATIVE H&P  Chief Complaint: nasal obstruction  HPI: Kathleen Sullivan is a 39 y.o. female who presents for evaluation of nasal obstruction and history of sinus infections. She's had a long history of trouble breathing through her nose and history of sinus infections. She had a recent CT scan of the sinus that showed relatively clear sinuses with septal deviation to the left. No polyps or obstruction of the sinuses noted. She's taken to the OR for septoplasty and turbinate reductions.  Past Medical History  Diagnosis Date  . Personal history of unspecified circulatory disease   . Hallux rigidus   . Condyloma acuminatum   . Unspecified chronic bronchitis   . Allergy, unspecified not elsewhere classified   . Headache(784.0)   . Panic disorder without agoraphobia   . Other malignant neoplasm without specification of site     skin cancer  . Mild mitral regurgitation   . Mild tricuspid regurgitation   . Other specified personal history presenting hazards to health(V15.89)     atypical hx of chest pain  . Unspecified asthma(493.90)     allergy induced asthma  . Adrenal failure     takes solucortef daily  . Anxiety   . Depression   . Esophageal reflux     takes OTC when needed   Past Surgical History  Procedure Laterality Date  . Finger surgery    . Cesarean section    . Allergies     Social History   Social History  . Marital Status: Married    Spouse Name: N/A  . Number of Children: 1  . Years of Education: 13   Occupational History  . pharmacy tech   . student     nursing   Social History Main Topics  . Smoking status: Former Research scientist (life sciences)  . Smokeless tobacco: Never Used     Comment: >15 yrs ago.  . Alcohol Use: Yes     Comment: social  . Drug Use: No  . Sexual Activity:    Partners: Male     Comment: has had Essure   Other Topics Concern  . None   Social History Narrative   HSG, doing college studies - online (Feb '13). Married '01, 1 dtr - '05. 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 Breast cancer Mother   . Uterine cancer Mother   . Mental illness Mother   . Colon cancer Neg Hx   . Diabetes Brother   . Breast cancer Other   . Coronary artery disease Other   . Stroke Other   . Diabetes Other   . Hypertension Other   . Anxiety disorder Mother    Allergies  Allergen Reactions  . Metoclopramide Hcl Other (See Comments)    "uncontrolled jerking of the mouth"   Prior to Admission medications   Medication Sig Start Date End Date Taking? Authorizing Provider  ALPRAZolam Duanne Moron) 0.5 MG tablet TAKE 1 TABLET BY MOUTH EVERY DAY AS NEEDED FOR ANXIETY 04/05/15  Yes Olga Millers, MD  BELSOMRA 10 MG TABS TAKE ONE TABLET BY MOUTH NIGHTLY AT BEDTIME 05/08/15  Yes Olga Millers, MD  buPROPion (WELLBUTRIN XL) 300 MG 24 hr tablet TAKE ONE TABLET BY MOUTH ONE TIME DAILY  01/02/15  Yes Olga Millers, MD  fexofenadine (ALLEGRA) 180 MG tablet Take 180 mg by mouth daily.   Yes Historical Provider, MD  gabapentin (NEURONTIN) 300 MG capsule Take 300 mg by  mouth 2 (two) times daily.   Yes Historical Provider, MD  Melatonin 10 MG TABS Take 1 tablet by mouth at bedtime.   Yes Historical Provider, MD  montelukast (SINGULAIR) 10 MG tablet Take 1 tablet (10 mg total) by mouth at bedtime. 11/10/14  Yes Olga Millers, MD  pseudoephedrine (SUDAFED) 60 MG tablet Take 60 mg by mouth every 4 (four) hours as needed for congestion.   Yes Historical Provider, MD  tretinoin (RETIN-A) 0.1 % cream Apply topically at bedtime. 12/26/13  Yes Neena Rhymes, MD  albuterol (PROAIR HFA) 108 (90 BASE) MCG/ACT inhaler Inhale 2 puffs into the lungs 4 (four) times daily. 12/29/13   Neena Rhymes, MD  hydrocortisone (CORTEF) 5 MG tablet TAKE 2 TABLETS BY MOUTH EVERY MORNING AND 1 TABLET EVERY EVENING NO LATER THAN 6PM 06/06/15   Philemon Kingdom, MD  hydrocortisone sodium succinate (SOLU-CORTEF) 100 MG SOLR injection Inject  in the muscle 100 mg as needed if you cannot take the hydrocortisone by mouth 03/22/14   Philemon Kingdom, MD  SYRINGE-NEEDLE, DISP, 3 ML (B-D SYRINGE/NEEDLE 3CC/25GX5/8) 25G X 5/8" 3 ML MISC Use as needed 03/22/14   Philemon Kingdom, MD  Water For Inject Bact Parabens (STERILE WATER, BACTERIOSTATIC,) SOLN injection Please dissolve the hydrocortisone solution and inject 100 mg in the muscle as needed 03/22/14   Philemon Kingdom, MD     Positive ROS: history of pressure between the eyes and in the cheek region  All other systems have been reviewed and were otherwise negative with the exception of those mentioned in the HPI and as above.  Physical Exam: There were no vitals filed for this visit.  General: Alert, no acute distress Oral: Normal oral mucosa and tonsils Nasal: Septal deviation to the left, no polyps noted. Middle meatus region clear Neck: No palpable adenopathy or thyroid nodules Ear: Ear canal is clear with normal appearing TMs Cardiovascular: Regular rate and rhythm, no murmur.  Respiratory: Clear to auscultation Neurologic: Alert and oriented x 3   Assessment/Plan: sinus disease. Septal deviation with nasal obstruction Plan for Procedure(s): NASAL SEPTOPLASTY WITH TURBINATE REDUCTION    Melony Overly, MD 06/06/2015 9:01 PM

## 2015-06-07 ENCOUNTER — Encounter: Payer: Self-pay | Admitting: Internal Medicine

## 2015-06-07 ENCOUNTER — Ambulatory Visit (HOSPITAL_BASED_OUTPATIENT_CLINIC_OR_DEPARTMENT_OTHER): Payer: 59 | Admitting: Anesthesiology

## 2015-06-07 ENCOUNTER — Telehealth: Payer: Self-pay | Admitting: Internal Medicine

## 2015-06-07 ENCOUNTER — Encounter (HOSPITAL_BASED_OUTPATIENT_CLINIC_OR_DEPARTMENT_OTHER): Payer: Self-pay | Admitting: *Deleted

## 2015-06-07 ENCOUNTER — Encounter (HOSPITAL_BASED_OUTPATIENT_CLINIC_OR_DEPARTMENT_OTHER)
Admission: RE | Disposition: A | Discharge status: Home / Self Care | Payer: Self-pay | Source: Ambulatory Visit | Attending: Otolaryngology

## 2015-06-07 ENCOUNTER — Ambulatory Visit (HOSPITAL_BASED_OUTPATIENT_CLINIC_OR_DEPARTMENT_OTHER)
Admission: RE | Admit: 2015-06-07 | Discharge: 2015-06-07 | Disposition: A | Discharge status: Home / Self Care | Payer: 59 | Source: Ambulatory Visit | Attending: Otolaryngology | Admitting: Otolaryngology

## 2015-06-07 DIAGNOSIS — Z79899 Other long term (current) drug therapy: Secondary | ICD-10-CM | POA: Insufficient documentation

## 2015-06-07 DIAGNOSIS — J3489 Other specified disorders of nose and nasal sinuses: Secondary | ICD-10-CM | POA: Insufficient documentation

## 2015-06-07 DIAGNOSIS — J342 Deviated nasal septum: Secondary | ICD-10-CM | POA: Insufficient documentation

## 2015-06-07 DIAGNOSIS — F329 Major depressive disorder, single episode, unspecified: Secondary | ICD-10-CM | POA: Diagnosis not present

## 2015-06-07 DIAGNOSIS — K219 Gastro-esophageal reflux disease without esophagitis: Secondary | ICD-10-CM | POA: Insufficient documentation

## 2015-06-07 DIAGNOSIS — J45909 Unspecified asthma, uncomplicated: Secondary | ICD-10-CM | POA: Insufficient documentation

## 2015-06-07 DIAGNOSIS — F419 Anxiety disorder, unspecified: Secondary | ICD-10-CM | POA: Insufficient documentation

## 2015-06-07 DIAGNOSIS — E2740 Unspecified adrenocortical insufficiency: Secondary | ICD-10-CM | POA: Diagnosis not present

## 2015-06-07 DIAGNOSIS — J343 Hypertrophy of nasal turbinates: Secondary | ICD-10-CM | POA: Insufficient documentation

## 2015-06-07 DIAGNOSIS — Z87891 Personal history of nicotine dependence: Secondary | ICD-10-CM | POA: Diagnosis not present

## 2015-06-07 HISTORY — DX: Anxiety disorder, unspecified: F41.9

## 2015-06-07 HISTORY — DX: Depression, unspecified: F32.A

## 2015-06-07 HISTORY — DX: Other adrenocortical insufficiency: E27.49

## 2015-06-07 HISTORY — DX: Major depressive disorder, single episode, unspecified: F32.9

## 2015-06-07 HISTORY — PX: NASAL SEPTOPLASTY W/ TURBINOPLASTY: SHX2070

## 2015-06-07 LAB — POCT HEMOGLOBIN-HEMACUE: Hemoglobin: 14.8 g/dL (ref 12.0–15.0)

## 2015-06-07 SURGERY — SEPTOPLASTY, NOSE, WITH NASAL TURBINATE REDUCTION
Anesthesia: General | Site: Nose

## 2015-06-07 MED ORDER — ARTIFICIAL TEARS OP OINT
TOPICAL_OINTMENT | OPHTHALMIC | Status: AC
  Filled 2015-06-07: qty 3.5

## 2015-06-07 MED ORDER — GLYCOPYRROLATE 0.2 MG/ML IJ SOLN
INTRAMUSCULAR | Status: AC
  Filled 2015-06-07: qty 1

## 2015-06-07 MED ORDER — SODIUM CHLORIDE 0.9 % IJ SOLN
INTRAMUSCULAR | Status: DC | PRN
  Administered 2015-06-07: 4.5 mL

## 2015-06-07 MED ORDER — SODIUM CHLORIDE 0.9 % IJ SOLN
INTRAMUSCULAR | Status: AC
  Filled 2015-06-07: qty 10

## 2015-06-07 MED ORDER — FENTANYL CITRATE (PF) 100 MCG/2ML IJ SOLN
INTRAMUSCULAR | Status: AC
  Filled 2015-06-07: qty 4

## 2015-06-07 MED ORDER — DEXAMETHASONE SODIUM PHOSPHATE 10 MG/ML IJ SOLN
INTRAMUSCULAR | Status: AC
  Filled 2015-06-07: qty 1

## 2015-06-07 MED ORDER — PROMETHAZINE HCL 25 MG/ML IJ SOLN: 6.2500 mg | INTRAMUSCULAR | Status: DC | PRN

## 2015-06-07 MED ORDER — HYDROMORPHONE HCL 1 MG/ML IJ SOLN
0.2500 mg | INTRAMUSCULAR | Status: DC | PRN
  Administered 2015-06-07: 0.25 mg via INTRAVENOUS
  Administered 2015-06-07 (×2): 0.5 mg via INTRAVENOUS
  Administered 2015-06-07: 0.25 mg via INTRAVENOUS
  Administered 2015-06-07: 0.5 mg via INTRAVENOUS

## 2015-06-07 MED ORDER — FENTANYL CITRATE (PF) 100 MCG/2ML IJ SOLN
INTRAMUSCULAR | Status: AC
  Filled 2015-06-07: qty 2

## 2015-06-07 MED ORDER — LACTATED RINGERS IV SOLN
INTRAVENOUS | Status: DC
  Administered 2015-06-07 (×3): via INTRAVENOUS

## 2015-06-07 MED ORDER — DEXAMETHASONE SODIUM PHOSPHATE 4 MG/ML IJ SOLN
INTRAMUSCULAR | Status: DC | PRN
  Administered 2015-06-07: 10 mg via INTRAVENOUS

## 2015-06-07 MED ORDER — HYDROMORPHONE HCL 1 MG/ML IJ SOLN
0.5000 mg | INTRAMUSCULAR | Status: AC | PRN
  Administered 2015-06-07 (×2): 0.5 mg via INTRAVENOUS

## 2015-06-07 MED ORDER — OXYCODONE HCL 5 MG PO TABS
5.0000 mg | ORAL_TABLET | Freq: Once | ORAL | Status: AC
  Administered 2015-06-07: 5 mg via ORAL

## 2015-06-07 MED ORDER — CEFAZOLIN SODIUM-DEXTROSE 2-3 GM-% IV SOLR
2.0000 g | INTRAVENOUS | Status: AC
  Administered 2015-06-07: 2 g via INTRAVENOUS

## 2015-06-07 MED ORDER — ONDANSETRON HCL 4 MG/2ML IJ SOLN
INTRAMUSCULAR | Status: DC | PRN
  Administered 2015-06-07: 4 mg via INTRAVENOUS

## 2015-06-07 MED ORDER — MIDAZOLAM HCL 2 MG/2ML IJ SOLN
INTRAMUSCULAR | Status: AC
  Filled 2015-06-07: qty 4

## 2015-06-07 MED ORDER — METHYLPREDNISOLONE ACETATE 80 MG/ML IJ SUSP
INTRAMUSCULAR | Status: AC
  Filled 2015-06-07: qty 1

## 2015-06-07 MED ORDER — FENTANYL CITRATE (PF) 100 MCG/2ML IJ SOLN
50.0000 ug | Freq: Once | INTRAMUSCULAR | Status: AC
  Administered 2015-06-07: 50 ug via INTRAVENOUS

## 2015-06-07 MED ORDER — SUCCINYLCHOLINE CHLORIDE 20 MG/ML IJ SOLN
INTRAMUSCULAR | Status: AC
  Filled 2015-06-07: qty 1

## 2015-06-07 MED ORDER — FENTANYL CITRATE (PF) 100 MCG/2ML IJ SOLN
50.0000 ug | INTRAMUSCULAR | Status: AC | PRN
  Administered 2015-06-07 (×2): 50 ug via INTRAVENOUS
  Administered 2015-06-07: 25 ug via INTRAVENOUS
  Administered 2015-06-07: 50 ug via INTRAVENOUS
  Administered 2015-06-07 (×2): 25 ug via INTRAVENOUS

## 2015-06-07 MED ORDER — BACITRACIN ZINC 500 UNIT/GM EX OINT
TOPICAL_OINTMENT | CUTANEOUS | Status: AC
  Filled 2015-06-07: qty 28.35

## 2015-06-07 MED ORDER — OXYMETAZOLINE HCL 0.05 % NA SOLN
NASAL | Status: DC | PRN
  Administered 2015-06-07: 1

## 2015-06-07 MED ORDER — CEPHALEXIN 500 MG PO CAPS: 500.0000 mg | ORAL_CAPSULE | Freq: Two times a day (BID) | ORAL | Status: AC

## 2015-06-07 MED ORDER — CEFAZOLIN SODIUM-DEXTROSE 2-3 GM-% IV SOLR
INTRAVENOUS | Status: AC
  Filled 2015-06-07: qty 50

## 2015-06-07 MED ORDER — HYDROCODONE-ACETAMINOPHEN 5-325 MG PO TABS: 1.0000 | ORAL_TABLET | Freq: Four times a day (QID) | ORAL | Status: DC | PRN

## 2015-06-07 MED ORDER — ALPRAZOLAM 0.5 MG PO TABS: 0.5000 mg | ORAL_TABLET | Freq: Every day | ORAL | Status: DC | PRN

## 2015-06-07 MED ORDER — METHYLPREDNISOLONE ACETATE 80 MG/ML IJ SUSP
INTRAMUSCULAR | Status: DC | PRN
  Administered 2015-06-07: 80 mg

## 2015-06-07 MED ORDER — HYDROMORPHONE HCL 1 MG/ML IJ SOLN
INTRAMUSCULAR | Status: AC
  Filled 2015-06-07: qty 1

## 2015-06-07 MED ORDER — LIDOCAINE-EPINEPHRINE 1 %-1:100000 IJ SOLN
INTRAMUSCULAR | Status: DC | PRN
  Administered 2015-06-07: 9 mL
  Administered 2015-06-07: 4.5 mL

## 2015-06-07 MED ORDER — LIDOCAINE HCL (CARDIAC) 10 MG/ML IV SOLN
INTRAVENOUS | Status: DC | PRN
  Administered 2015-06-07: 80 mg via INTRAVENOUS

## 2015-06-07 MED ORDER — SUCCINYLCHOLINE CHLORIDE 20 MG/ML IJ SOLN
INTRAMUSCULAR | Status: DC | PRN
  Administered 2015-06-07: 100 mg via INTRAVENOUS

## 2015-06-07 MED ORDER — PROPOFOL 10 MG/ML IV BOLUS
INTRAVENOUS | Status: DC | PRN
  Administered 2015-06-07: 160 mg via INTRAVENOUS

## 2015-06-07 MED ORDER — MIDAZOLAM HCL 2 MG/2ML IJ SOLN
1.0000 mg | INTRAMUSCULAR | Status: DC | PRN
  Administered 2015-06-07: 2 mg via INTRAVENOUS

## 2015-06-07 MED ORDER — PROPOFOL 500 MG/50ML IV EMUL
INTRAVENOUS | Status: AC
  Filled 2015-06-07: qty 50

## 2015-06-07 MED ORDER — ONDANSETRON HCL 4 MG/2ML IJ SOLN
INTRAMUSCULAR | Status: AC
  Filled 2015-06-07: qty 2

## 2015-06-07 MED ORDER — OXYMETAZOLINE HCL 0.05 % NA SOLN
NASAL | Status: AC
  Filled 2015-06-07: qty 15

## 2015-06-07 MED ORDER — OXYCODONE HCL 5 MG PO TABS
ORAL_TABLET | ORAL | Status: AC
  Filled 2015-06-07: qty 1

## 2015-06-07 MED ORDER — GLYCOPYRROLATE 0.2 MG/ML IJ SOLN
0.2000 mg | Freq: Once | INTRAMUSCULAR | Status: AC | PRN
  Administered 2015-06-07: 0.2 mg via INTRAVENOUS

## 2015-06-07 MED ORDER — BACITRACIN ZINC 500 UNIT/GM EX OINT
TOPICAL_OINTMENT | CUTANEOUS | Status: DC | PRN
  Administered 2015-06-07: 1 via TOPICAL

## 2015-06-07 MED ORDER — LIDOCAINE-EPINEPHRINE 1 %-1:100000 IJ SOLN
INTRAMUSCULAR | Status: AC
  Filled 2015-06-07: qty 1

## 2015-06-07 MED ORDER — SCOPOLAMINE 1 MG/3DAYS TD PT72: 1.0000 | MEDICATED_PATCH | Freq: Once | TRANSDERMAL | Status: DC | PRN

## 2015-06-07 SURGICAL SUPPLY — 35 items
ATTRACTOMAT 16X20 MAGNETIC DRP (DRAPES) IMPLANT
BLADE INF TURB ROT M4 2 5PK (BLADE) ×1 IMPLANT
CANISTER SUCT 1200ML W/VALVE (MISCELLANEOUS) ×2 IMPLANT
COAGULATOR SUCT 8FR VV (MISCELLANEOUS) ×2 IMPLANT
DECANTER SPIKE VIAL GLASS SM (MISCELLANEOUS) ×1 IMPLANT
DRSG TELFA 3X8 NADH (GAUZE/BANDAGES/DRESSINGS) ×2 IMPLANT
ELECT REM PT RETURN 9FT ADLT (ELECTROSURGICAL) ×2
ELECTRODE REM PT RTRN 9FT ADLT (ELECTROSURGICAL) ×1 IMPLANT
GLOVE EXAM NITRILE MD LF STRL (GLOVE) ×1 IMPLANT
GLOVE SS BIOGEL STRL SZ 7.5 (GLOVE) ×1 IMPLANT
GLOVE SUPERSENSE BIOGEL SZ 7.5 (GLOVE) ×1
GLOVE SURG SS PI 7.0 STRL IVOR (GLOVE) ×1 IMPLANT
GOWN STRL REUS W/ TWL LRG LVL3 (GOWN DISPOSABLE) ×2 IMPLANT
GOWN STRL REUS W/TWL LRG LVL3 (GOWN DISPOSABLE) ×4
IV NS 500ML (IV SOLUTION) ×2
IV NS 500ML BAXH (IV SOLUTION) IMPLANT
NDL PRECISIONGLIDE 27X1.5 (NEEDLE) ×1 IMPLANT
NEEDLE PRECISIONGLIDE 27X1.5 (NEEDLE) ×2 IMPLANT
NS IRRIG 1000ML POUR BTL (IV SOLUTION) ×1 IMPLANT
PACK BASIN DAY SURGERY FS (CUSTOM PROCEDURE TRAY) ×2 IMPLANT
PACK ENT DAY SURGERY (CUSTOM PROCEDURE TRAY) ×2 IMPLANT
PAD DRESSING TELFA 3X8 NADH (GAUZE/BANDAGES/DRESSINGS) ×1 IMPLANT
PATTIES SURGICAL .5 X3 (DISPOSABLE) ×2 IMPLANT
SHEET SILASTIC 8X6X.030 25-30 (MISCELLANEOUS) IMPLANT
SLEEVE SCD COMPRESS KNEE MED (MISCELLANEOUS) ×2 IMPLANT
SPONGE GAUZE 2X2 8PLY STRL LF (GAUZE/BANDAGES/DRESSINGS) ×2 IMPLANT
SUT CHROMIC 4 0 PS 2 18 (SUTURE) ×2 IMPLANT
SUT ETHILON 3 0 PS 1 (SUTURE) ×1 IMPLANT
SUT SILK 2 0 FS (SUTURE) ×1 IMPLANT
SUT VIC AB 4-0 P-3 18XBRD (SUTURE) IMPLANT
SUT VIC AB 4-0 P3 18 (SUTURE)
SYR 3ML 18GX1 1/2 (SYRINGE) ×1 IMPLANT
TOWEL OR 17X24 6PK STRL BLUE (TOWEL DISPOSABLE) ×4 IMPLANT
TRAY DSU PREP LF (CUSTOM PROCEDURE TRAY) ×2 IMPLANT
YANKAUER SUCT BULB TIP NO VENT (SUCTIONS) ×2 IMPLANT

## 2015-06-07 NOTE — Discharge Instructions (Addendum)
Take your regular meds Start Keflex 500 mg twice per day tonight for 1 week Tylenol, motrin or hydrocodone 5 mg tabs 1-2 every 6 hrs prn pain Elevate head and apply cool compress to nose to reduce swelling and bleeding Return to see Dr Lucia Gaskins at his office tomorrow at 4:15 to have the nasal packing removed   Post Anesthesia Home Care Instructions  Activity: Get plenty of rest for the remainder of the day. A responsible adult should stay with you for 24 hours following the procedure.  For the next 24 hours, DO NOT: -Drive a car -Paediatric nurse -Drink alcoholic beverages -Take any medication unless instructed by your physician -Make any legal decisions or sign important papers.  Meals: Start with liquid foods such as gelatin or soup. Progress to regular foods as tolerated. Avoid greasy, spicy, heavy foods. If nausea and/or vomiting occur, drink only clear liquids until the nausea and/or vomiting subsides. Call your physician if vomiting continues.  Special Instructions/Symptoms: Your throat may feel dry or sore from the anesthesia or the breathing tube placed in your throat during surgery. If this causes discomfort, gargle with warm salt water. The discomfort should disappear within 24 hours.  If you had a scopolamine patch placed behind your ear for the management of post- operative nausea and/or vomiting:  1. The medication in the patch is effective for 72 hours, after which it should be removed.  Wrap patch in a tissue and discard in the trash. Wash hands thoroughly with soap and water. 2. You may remove the patch earlier than 72 hours if you experience unpleasant side effects which may include dry mouth, dizziness or visual disturbances. 3. Avoid touching the patch. Wash your hands with soap and water after contact with the patch.

## 2015-06-07 NOTE — Telephone Encounter (Signed)
Kathleen Sullivan, can you please call them and read them my last entry in the chart  -  From this morning.

## 2015-06-07 NOTE — Progress Notes (Signed)
Received message from on call Dr., Dr. Larose Kells, that he was called yesterday about patient's need for steroid during her surgery today.  I have discussed with Dr. Lucia Gaskins over the phone 2 days ago regarding using the following protocol: - 50 mg IV hydrocortisone before the surgery - Starting this p.m., she can resume her home regimen, of hydrocortisone 10 mg in a.m. and 5 mg in p.m.

## 2015-06-07 NOTE — Telephone Encounter (Signed)
Please read message below.  

## 2015-06-07 NOTE — Anesthesia Procedure Notes (Signed)
Procedure Name: Intubation Date/Time: 06/07/2015 7:47 AM Performed by: Lyndee Leo Pre-anesthesia Checklist: Patient identified, Emergency Drugs available, Suction available and Patient being monitored Patient Re-evaluated:Patient Re-evaluated prior to inductionOxygen Delivery Method: Circle System Utilized Preoxygenation: Pre-oxygenation with 100% oxygen Intubation Type: IV induction Ventilation: Mask ventilation without difficulty Laryngoscope Size: Mac and 3 Grade View: Grade II Tube type: Oral Rae Number of attempts: 1 Airway Equipment and Method: Stylet and Oral airway Placement Confirmation: ETT inserted through vocal cords under direct vision,  positive ETCO2 and breath sounds checked- equal and bilateral Tube secured with: Tape Dental Injury: Teeth and Oropharynx as per pre-operative assessment

## 2015-06-07 NOTE — Brief Op Note (Signed)
06/07/2015  9:43 AM  PATIENT:  Kathleen Sullivan  39 y.o. female  PRE-OPERATIVE DIAGNOSIS:  Chronic nasal obstruction and sinus disease  POST-OPERATIVE DIAGNOSIS:  Chronic nasal obstruction and sinus disease  PROCEDURE:  Procedure(s): NASAL SEPTOPLASTY WITH TURBINATE REDUCTION  (N/A)  SURGEON:  Surgeon(s) and Role:    * Rozetta Nunnery, MD - Primary  PHYSICIAN ASSISTANT:   ASSISTANTS: none   ANESTHESIA:   general  EBL:  Total I/O In: 1000 [I.V.:1000] Out: -   BLOOD ADMINISTERED:none  DRAINS: none   LOCAL MEDICATIONS USED:  XYLOCAINE with EPI  12 cc  SPECIMEN:  No Specimen  DISPOSITION OF SPECIMEN:  N/A  COUNTS:  YES  TOURNIQUET:  * No tourniquets in log *  DICTATION: .Other Dictation: Dictation Number [id number]  PLAN OF CARE: Discharge to home after PACU  PATIENT DISPOSITION:  PACU - hemodynamically stable.   Delay start of Pharmacological VTE agent (>24hrs) due to surgical blood loss or risk of bleeding: yes

## 2015-06-07 NOTE — Telephone Encounter (Signed)
Will fill for one month only and needs visit. Way overdue.

## 2015-06-07 NOTE — Interval H&P Note (Signed)
History and Physical Interval Note:  06/07/2015 7:29 AM  Kathleen Sullivan  has presented today for surgery, with the diagnosis of sinus disease  The various methods of treatment have been discussed with the patient and family. After consideration of risks, benefits and other options for treatment, the patient has consented to  Procedure(s): NASAL SEPTOPLASTY WITH TURBINATE REDUCTION  (N/A) as a surgical intervention .  The patient's history has been reviewed, patient examined, no change in status, stable for surgery.  I have reviewed the patient's chart and labs.  Questions were answered to the patient's satisfaction.     NEWMAN, CHRISTOPHER

## 2015-06-07 NOTE — Transfer of Care (Signed)
Immediate Anesthesia Transfer of Care Note  Patient: Kathleen Sullivan  Procedure(s) Performed: Procedure(s): NASAL SEPTOPLASTY WITH TURBINATE REDUCTION  (N/A)  Patient Location: PACU  Anesthesia Type:General  Level of Consciousness: awake, alert  and patient cooperative  Airway & Oxygen Therapy: Patient Spontanous Breathing and aerosol face mask  Post-op Assessment: Report given to RN and Post -op Vital signs reviewed and stable  Post vital signs: Reviewed and stable  Last Vitals:  Filed Vitals:   06/07/15 0635  BP: 124/83  Pulse: 79  Temp: 36.6 C  Resp: 18    Complications: No apparent anesthesia complications

## 2015-06-07 NOTE — Telephone Encounter (Signed)
[facility] note dated 06/06/15 5:31 PM Chief Complaint Prescription Refill or Medication Request (non symptomatic) Initial Comment Caller states she Kathleen Sullivan is having surgery on her deviated septum in the morning. She has adrenial failure and wants to now if she should take a different amount of medication for the surgery Nurse Assessment Nurse: Margaretmary Dys, RN, Hettie Date/Time (Eastern Time): 06/06/2015 5:31:49 PM Confirm and document reason for call. If symptomatic, describe symptoms. ---Caller states she Kathleen Sullivan is having surgery on her deviated septum in the morning. She has adrenal failure and wants to now if she should take a different amount of medication for the surgery Takes hydrocortisone 5 mg tid. If sick, takes extra. Needs to know if supposed to take extra. Has liq hydrocortisone for ER, too. No symptoms. Has the patient traveled out of the country within the last 30 days? ---Not Applicable Does the patient require triage? ---No Guidelines Guideline Title Affirmed Question Affirmed Notes Nurse Date/Time (Eastern Time) Disp. Time Eilene Ghazi Time) Disposition Final User 06/06/2015 5:36:36 PM Paged On Call back to [facility] [place], [place], [facility] 06/06/2015 6:06:22 PM Called On-Call Provider Limon, RN, Hettie 06/06/2015 6:08:48 PM Paged On Call back to [facility], [place], [facility] 06/06/2015 6:28:58 PM Paged On Call back to [facility], [place], [facility] 06/06/2015 6:43:36 PM Paged On Call back to [facility], [place], Hettie 06/06/2015 7:14:00 PM Clinical Call Yes Margaretmary Dys, RN, Hettie After Care Instructions Given Call Event Type User Date / Time Description PLEASE NOTE: All timestamps contained within this report are represented as [place] Standard Time. CONFIDENTIALTY NOTICE: This fax transmission is intended only for the addressee. It contains information that is legally privileged, confidential or otherwise protected from use or disclosure. If you  are not the intended recipient, you are strictly prohibited from reviewing, disclosing, copying using or disseminating any of this information or taking any action in reliance on or regarding this information. If you have received this fax in error, please notify us immediately by telephone so that we can arrange for its return to [place]. Phone: [phone], Toll-Free: [phone], Fax: [phone] Page: 2 of 2 Call Id: [id number] Comments User: Chalmers Guest, RN Date/Time Eilene Ghazi Time): 06/06/2015 7:13:37 PM Spoke to the pt and advised unable to get a hold of endo on-call. Pt stated an understanding and will try to f/u with hosp staff in the morning. User: Chalmers Guest, RN Date/Time Eilene Ghazi Time): 06/06/2015 7:21:44 PM Spoke with Dr Larose Kells. Dr Larose Kells will call pt directly. Paging DoctorName Phone DateTime Result/Outcome Message Type Notes Kathlene November [phone] 06/06/2015 5:36:36 PM Paged On Call Back to Call Center Doctor Paged Please call Hettie at [facility] at [phone]. Kathlene November 06/06/2015 6:01:59 PM Unable to Reach on call - Max Attempts Message Result 1st att Kathlene November [phone] 06/06/2015 6:06:22 PM Called On Call Provider - Reached Doctor Paged Kathlene November 06/06/2015 6:08:00 PM Spoke with On Call - General Message Result Dr Larose Kells gave this RN endo on-call # - Dr Dwyane Dee [phone]. Attempted #. Wrong #. Kathlene November [id number] 06/06/2015 6:08:48 PM Called On Call Provider - Left Message Doctor Paged Kathlene November 06/06/2015 6:28:18 PM Unable to Reach on call - Max Attempts [place], [place] [phone] 06/06/2015 6:28:58 PM Called On Call Provider - Left Message Doctor Paged Kathlene November 06/06/2015 6:42:59 PM Unable to Reach on call - Max Attempts Message Result Dr Larose Kells had said to call back if unable to reach Dr Dwyane Dee. Kathlene November [phone] 06/06/2015 6:43:36 PM Paged On  Call Back to Call Center Doctor Paged Please call Hettie at [facility] at [phone]. Kathlene November 06/06/2015 7:09:24 PM Unable to Reach on call - Max Attempts Message Result

## 2015-06-07 NOTE — Anesthesia Preprocedure Evaluation (Addendum)
Anesthesia Evaluation  Patient identified by MRN, date of birth, ID band Patient awake    Reviewed: Allergy & Precautions, NPO status , reviewed documented beta blocker date and time   Airway Mallampati: II  TM Distance: >3 FB Neck ROM: Full    Dental   Pulmonary asthma , former smoker,  breath sounds clear to auscultation        Cardiovascular negative cardio ROS  Rhythm:Regular Rate:Normal     Neuro/Psych    GI/Hepatic Neg liver ROS, GERD-  ,  Endo/Other  History of adrenal failure noted. CE  Renal/GU negative Renal ROS     Musculoskeletal   Abdominal   Peds  Hematology   Anesthesia Other Findings   Reproductive/Obstetrics                           Anesthesia Physical Anesthesia Plan  ASA: III  Anesthesia Plan: General   Post-op Pain Management:    Induction: Intravenous  Airway Management Planned: Oral ETT  Additional Equipment:   Intra-op Plan:   Post-operative Plan: Extubation in OR  Informed Consent: I have reviewed the patients History and Physical, chart, labs and discussed the procedure including the risks, benefits and alternatives for the proposed anesthesia with the patient or authorized representative who has indicated his/her understanding and acceptance.   Dental advisory given  Plan Discussed with: CRNA and Anesthesiologist  Anesthesia Plan Comments:         Anesthesia Quick Evaluation

## 2015-06-07 NOTE — Anesthesia Postprocedure Evaluation (Signed)
  Anesthesia Post-op Note  Patient: Kathleen Sullivan  Procedure(s) Performed: Procedure(s): NASAL SEPTOPLASTY WITH TURBINATE REDUCTION  (N/A)  Patient Location: PACU  Anesthesia Type:General  Level of Consciousness: awake  Airway and Oxygen Therapy: Patient Spontanous Breathing  Post-op Pain: mild  Post-op Assessment: Post-op Vital signs reviewed              Post-op Vital Signs: Reviewed  Last Vitals:  Filed Vitals:   06/07/15 1100  BP: 120/67  Pulse: 82  Temp:   Resp: 15    Complications: No apparent anesthesia complications

## 2015-06-08 ENCOUNTER — Encounter (HOSPITAL_BASED_OUTPATIENT_CLINIC_OR_DEPARTMENT_OTHER): Payer: Self-pay | Admitting: Otolaryngology

## 2015-06-08 NOTE — Op Note (Signed)
NAMELARAE, CAISON           ACCOUNT NO.:  [id number]  MEDICAL RECORD NO.:  [id number]  LOCATION:                               FACILITY:  [facility]  PHYSICIAN:  Leonides Sake. 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  OPERATION PERFORMED:  Septoplasty with bilateral inferior turbinate reductions with Medtronic turbinate blade.  SURGEON:  Leonides Sake. Lucia Gaskins, M.D.  ANESTHESIA:  General endotracheal.  COMPLICATIONS:  None.  BRIEF CLINICAL NOTE:  Kathleen Sullivan is a 39 year old female who has a history of secondary adrenal insufficiency as well as allergy and asthma with chronic nasal obstruction and history of sinus disease and sinus infections.  On exam, she has a fairly significant septal deviation to the left with turbinate hypertrophy.  She underwent a CT scan of her sinuses that showed actually fairly clear paranasal sinuses, but it did demonstrate the marked septal deviation to the left.  She was taken to the operating room at this time for septoplasty and turbinate reductions to help to improve her breathing and nasal airway.  DESCRIPTION OF PROCEDURE:  After adequate endotracheal anesthesia, the patient received 2 g of Ancef and 10 mg of Decadron IV preoperatively. Nose was prepped with Betadine solution and draped out with sterile towels.  The nose was then further prepped with cotton pledgets, soaked in Afrin, and septum and turbinates were injected with Xylocaine with epinephrine for hemostasis.  On exam, the patient had fairly severe septal deviation to the left with very narrow left nasal passageway. The  right nasal passageway was fairly clear.  An extended hemitransfixion incision was made along the cartilaginous septum on the right side.  Mucoperichondrial and mucoperiosteal flaps were elevated posteriorly along the floor of the nose on the right side and along the entire portion of the cartilaginous septum of the left side.  The patient had two major deformities of the septum, one along the floor of the nose on the left side.  The cartilaginous septum had bowed off from the maxillary crest to the left airway.  In addition to more posteriorly, the bony vomer and the bony septum were deviated to the left.  After elevating mucoperichondrial and mucoperiosteal flaps posteriorly, the bony portion of the septum had bowed to the left airway was removed and separated from the cartilaginous septum and then along the floor of the nose, inferior 2 mm of cartilaginous septum was removed off from the maxillary crest and allowed the septum return more to the maxillary crest without bowing and obstructing the left airway.  This completed the septoplasty portion of the procedure.  Next using the Medtronic turbinate blade, submucosal turbinate reductions were performed bilaterally.  In addition on the left side, some of the inferior turbinate bone and little bit of inferior one-quarter of the turbinate were amputated on the left side only.  On the right side, Medtronic turbinate blade was performed to use submucosal turbinate reduction.  Following completion of the turbinate reductions, the hemitransfixion incision was closed with interrupted 4-0 chromic suture, the septum was basted with a 4-0 chromic suture and the splints were secured to either side of the septum with a 3-0 nylon suture.  Following completion of the procedure, 80 mg of Depo-Medrol was injected into the middle turbinate and inferior turbinate region on both sides.  This completed the procedure.  Nose was packed with Telfa soaked  in bacitracin ointment, placed along the floor of the nose on both sides and secured anteriorly with a 2-0 silk suture.  The patient was subsequently awakened from anesthesia and transferred to the recovery room on postop doing well.  DISPOSITION:  Kathleen Sullivan was discharged home later this morning on Keflex 500 mg b.i.d. for 1 week in addition to Tylenol, Motrin or hydrocodone 5 mg/325 mg tablets 1-2 q.6 hours p.r.n. pain.  She will continue with the regular medications and will follow up in my office tomorrow to have the nasal packs removed and will follow up in my office in 1 week to have these nasal septal splints removed.          ______________________________ Leonides Sake Lucia Gaskins, M.D.     CEN/MEDQ  D:  06/07/2015  T:  06/07/2015  Job:  [id number]  cc:   Vertell Novak, MD Philemon Kingdom, M.D.

## 2015-06-13 ENCOUNTER — Telehealth: Payer: Self-pay | Admitting: Internal Medicine

## 2015-06-13 NOTE — Telephone Encounter (Signed)
Pt called and wanted to know that status of her PA of her BELSOMRA 10 MG TABS [[id number]].     Are you working on a PA for this med?

## 2015-06-21 ENCOUNTER — Encounter: Payer: Self-pay | Admitting: Internal Medicine

## 2015-06-21 ENCOUNTER — Ambulatory Visit (INDEPENDENT_AMBULATORY_CARE_PROVIDER_SITE_OTHER): Payer: 59 | Admitting: Internal Medicine

## 2015-06-21 VITALS — BP 112/78 | HR 88 | Temp 99.2°F | Resp 14 | Ht 59.0 in | Wt 190.8 lb

## 2015-06-21 DIAGNOSIS — G479 Sleep disorder, unspecified: Secondary | ICD-10-CM | POA: Diagnosis not present

## 2015-06-21 DIAGNOSIS — J4520 Mild intermittent asthma, uncomplicated: Secondary | ICD-10-CM

## 2015-06-21 DIAGNOSIS — F3112 Bipolar disorder, current episode manic without psychotic features, moderate: Secondary | ICD-10-CM

## 2015-06-21 DIAGNOSIS — Z23 Encounter for immunization: Secondary | ICD-10-CM | POA: Diagnosis not present

## 2015-06-21 DIAGNOSIS — E2749 Other adrenocortical insufficiency: Secondary | ICD-10-CM

## 2015-06-21 MED ORDER — SUVOREXANT 15 MG PO TABS: 15.0000 mg | ORAL_TABLET | Freq: Every day | ORAL | Status: DC

## 2015-06-21 MED ORDER — ALPRAZOLAM 0.5 MG PO TABS: 0.5000 mg | ORAL_TABLET | Freq: Every day | ORAL | Status: DC | PRN

## 2015-06-21 MED ORDER — ALBUTEROL SULFATE 108 (90 BASE) MCG/ACT IN AEPB: 2.0000 | INHALATION_SPRAY | RESPIRATORY_TRACT | Status: DC | PRN

## 2015-06-21 NOTE — Assessment & Plan Note (Signed)
Mild intermittent, without flare. Her albuterol is expired and needs new. Respiclick rx given at visit today.

## 2015-06-21 NOTE — Progress Notes (Signed)
   Subjective:    Patient ID: Kathleen Sullivan, female    DOB: 1975-11-14, 39 y.o.   MRN: [id number]  HPI The patient is a 39 YO female coming in for several acute concerns. She is having more difficulty sleeping and has been out of belsomra for several weeks. Overall it was doing well at helping her to sleep. Previously had been tried on many things. Also uses xanax at night time to help avoid panic attacks. She has had only 3 panic attacks over the last several years. They happen at night time and she takes the xanax nightly to prevent attacks. 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 She wants to go up on the belsomra dosing to see if that helps more at night time. She also wants to go up on the xanax dosing as she does not feel that it is helping at its current dose. Has been on many things in the past including clonazepam. Still taking wellbutrin which helps some. Has tried many techniques to help with sleep including relaxation, avoidance of caffeine, sound to help get into delta sleep.   Review of Systems  Constitutional: Negative for fever, activity change, appetite change, fatigue and unexpected weight change.  HENT: Negative.   Respiratory: Negative for cough, chest tightness, shortness of breath and wheezing.   Cardiovascular: Negative for chest pain, palpitations and leg swelling.  Gastrointestinal: Negative for abdominal pain, diarrhea, constipation and abdominal distention.  Musculoskeletal: Negative for myalgias, arthralgias and gait problem.  Skin: Negative.   Neurological: Negative for dizziness, weakness, light-headedness and numbness.  Psychiatric/Behavioral: Positive for sleep disturbance. Negative for suicidal ideas and self-injury. The patient is nervous/anxious and is hyperactive.       Objective:   Physical Exam  Constitutional: She is oriented to person, place, and time. She appears well-developed and well-nourished.  Obese    HENT:  Head: Normocephalic and atraumatic.  Eyes: EOM are normal.  Neck: Normal range of motion.  Cardiovascular: Normal rate and regular rhythm.   No murmur heard. Pulmonary/Chest: Effort normal and breath sounds normal.  Abdominal: Soft. Bowel sounds are normal.  Neurological: She is alert and oriented to person, place, and time. Coordination normal.  Skin: Skin is warm and dry.  Psychiatric:  Hard to redirect and very chatty   Filed Vitals:   06/21/15 0830  BP: 112/78  Pulse: 88  Temp: 99.2 F (37.3 C)  TempSrc: Oral  Resp: 14  Height: 4' 11"  (1.499 m)  Weight: 190 lb 12.8 oz (86.546 kg)  SpO2: 98%      Assessment & Plan:  Flu shot given at visit

## 2015-06-21 NOTE — Assessment & Plan Note (Signed)
She has not been back to endocrinology and is not taking her medication as directed. She is directed to take 2 in the morning and 1 pill at night. She is taking 1 pill TID. Asked her to return to endocrinology and will not adjust her regimen.

## 2015-06-21 NOTE — Progress Notes (Signed)
Pre visit review using our clinic review tool, if applicable. No additional management support is needed unless otherwise documented below in the visit note. 

## 2015-06-21 NOTE — Assessment & Plan Note (Signed)
Unclear if this represents flare today or simply from lack of sleep due to being out of belsomra. She is definitely mood elevated today and not sleeping much last several days. She has done counseling in the past and does not want to resume although I suggested.

## 2015-06-21 NOTE — Patient Instructions (Signed)
It is very important that you go back to see the endocrinologist as they need to check certain blood tests to make sure that the medicine is effective for your adrenal insufficiency.   We have submitted the PA and will increase the belsomra dose to 15 mg nightly. We have refilled it and the xanax.   Come back in about 6 months for a physical.   We have given you a prescription and coupon for a free inhaler.

## 2015-06-21 NOTE — Assessment & Plan Note (Signed)
Xanax can take 1-2 pills at night time while off belsomra (new rx given today) as well as increase in belsomra and PA done for that today. Have asked her to try not taking the xanax nightly as she has not had any night panic attacks and she cannot agree with that.

## 2015-06-28 NOTE — Telephone Encounter (Signed)
PA started via cover my meds.

## 2015-06-29 ENCOUNTER — Other Ambulatory Visit: Payer: Self-pay | Admitting: Geriatric Medicine

## 2015-06-29 MED ORDER — BUPROPION HCL ER (XL) 300 MG PO TB24: 300.0000 mg | ORAL_TABLET | Freq: Every day | ORAL | Status: DC

## 2015-07-06 ENCOUNTER — Telehealth: Payer: Self-pay | Admitting: Internal Medicine

## 2015-07-06 MED ORDER — VALACYCLOVIR HCL 1 G PO TABS: 1000.0000 mg | ORAL_TABLET | Freq: Two times a day (BID) | ORAL | Status: DC

## 2015-07-06 NOTE — Telephone Encounter (Signed)
Sent in

## 2015-07-06 NOTE — Telephone Encounter (Signed)
Pt called request refill for Valtrex 1 gram to be send to [facility], pt stated Dr. Linda Hedges was the one that gave it to her last time. Please send in 30 days supply.

## 2015-07-11 ENCOUNTER — Telehealth: Payer: Self-pay

## 2015-07-11 NOTE — Telephone Encounter (Signed)
covermymeds KEY: [id number]

## 2015-07-16 ENCOUNTER — Telehealth: Payer: Self-pay | Admitting: *Deleted

## 2015-07-16 ENCOUNTER — Other Ambulatory Visit: Payer: Self-pay

## 2015-07-16 MED ORDER — OLOPATADINE HCL 0.2 % OP SOLN: 2.0000 [drp] | OPHTHALMIC | Status: DC | PRN

## 2015-07-16 NOTE — Telephone Encounter (Signed)
Left msg on triage stating pt is wanting to get a refill on her Pataday eye drops. Tried faxing md but haven't receive back....Johny Chess

## 2015-07-16 NOTE — Telephone Encounter (Signed)
error 

## 2015-07-16 NOTE — Telephone Encounter (Signed)
Sent in to [facility]

## 2015-07-16 NOTE — Telephone Encounter (Signed)
PA approved for Belsomra

## 2015-07-16 NOTE — Telephone Encounter (Signed)
Per ins they will only cover #31 per script. Asked Dr. Sharlet Salina if Sig could be changed to once a day. Dr. Sharlet Salina declines, she would like sig to stay the same. Pt will have to pay other 30 out of pocket. Pharmacy notified

## 2015-09-07 ENCOUNTER — Other Ambulatory Visit: Payer: Self-pay | Admitting: Internal Medicine

## 2015-10-17 ENCOUNTER — Other Ambulatory Visit: Payer: Self-pay | Admitting: Internal Medicine

## 2015-10-19 ENCOUNTER — Telehealth: Payer: Self-pay

## 2015-10-19 NOTE — Telephone Encounter (Signed)
New PA was sent over for [place] to complete for belsomra. PA completed key for this is [id number]

## 2015-10-29 ENCOUNTER — Other Ambulatory Visit (HOSPITAL_COMMUNITY)
Admission: RE | Admit: 2015-10-29 | Discharge: 2015-10-29 | Disposition: A | Discharge status: Home / Self Care | Payer: BLUE CROSS/BLUE SHIELD | Source: Ambulatory Visit | Attending: Family Medicine | Admitting: Family Medicine

## 2015-10-29 ENCOUNTER — Emergency Department (HOSPITAL_COMMUNITY)
Admission: EM | Admit: 2015-10-29 | Discharge: 2015-10-29 | Disposition: A | Discharge status: Home / Self Care | Payer: BLUE CROSS/BLUE SHIELD | Source: Home / Self Care | Attending: Family Medicine | Admitting: Family Medicine

## 2015-10-29 ENCOUNTER — Encounter (HOSPITAL_COMMUNITY): Payer: Self-pay | Admitting: *Deleted

## 2015-10-29 DIAGNOSIS — J020 Streptococcal pharyngitis: Secondary | ICD-10-CM | POA: Diagnosis present

## 2015-10-29 LAB — POCT RAPID STREP A: STREPTOCOCCUS, GROUP A SCREEN (DIRECT): NEGATIVE

## 2015-10-29 MED ORDER — AMOXICILLIN 500 MG PO CAPS: 500.0000 mg | ORAL_CAPSULE | Freq: Three times a day (TID) | ORAL | Status: DC

## 2015-10-29 NOTE — ED Notes (Signed)
Pt  Reports  Symptoms  Of  sorethroat       X   3  Days       with  Pain  When  She   Swallows           Symptoms  Not  releived  By   Tylenol  /   Aspirin

## 2015-10-29 NOTE — ED Provider Notes (Addendum)
CSN: [id number]     Arrival date & time 10/29/15  1904 History   First MD Initiated Contact with Patient 10/29/15 2021     Chief Complaint  Patient presents with  . Sore Throat   (Consider location/radiation/quality/duration/timing/severity/associated sxs/prior Treatment) Patient is a 40 y.o. female presenting with pharyngitis. The history is provided by the patient.  Sore Throat This is a new problem. The current episode started more than 2 days ago. The problem has been gradually worsening. The symptoms are aggravated by swallowing.    Past Medical History  Diagnosis Date  . Personal history of unspecified circulatory disease   . Hallux rigidus   . Condyloma acuminatum   . Unspecified chronic bronchitis ([place])   . Allergy, unspecified not elsewhere classified   . Headache(784.0)   . Panic disorder without agoraphobia   . Other malignant neoplasm without specification of site     skin cancer  . Mild mitral regurgitation   . Mild tricuspid regurgitation   . Other specified personal history presenting hazards to health(V15.89)     atypical hx of chest pain  . Unspecified asthma(493.90)     allergy induced asthma  . Adrenal failure ([place])     takes solucortef daily  . Anxiety   . Depression   . Esophageal reflux     takes OTC when needed   Past Surgical History  Procedure Laterality Date  . Finger surgery    . Cesarean section    . Allergies    . Nasal septoplasty w/ turbinoplasty N/A 06/07/2015    Procedure: NASAL SEPTOPLASTY WITH TURBINATE REDUCTION ;  Surgeon: Rozetta Nunnery, MD;  Location: [facility];  Service: ENT;  Laterality: N/A;   Family History  Problem Relation Age of Onset  . Breast cancer Mother   . Uterine cancer Mother   . Mental illness Mother   . Colon cancer Neg Hx   . Diabetes Brother   . Breast cancer Other   . Coronary artery disease Other   . Stroke Other   . Diabetes Other   . Hypertension Other   . Anxiety disorder Mother     Social History  Substance Use Topics  . Smoking status: Former Research scientist (life sciences)  . Smokeless tobacco: Never Used     Comment: >15 yrs ago.  . Alcohol Use: Yes     Comment: social   OB History    No data available     Review of Systems  Constitutional: Positive for fever, chills and appetite change.  HENT: Positive for congestion, postnasal drip, rhinorrhea, sore throat and trouble swallowing.   Respiratory: Negative.   Cardiovascular: Negative.   All other systems reviewed and are negative.   Allergies  Metoclopramide hcl  Home Medications   Prior to Admission medications   Medication Sig Start Date End Date Taking? Authorizing Provider  albuterol (PROAIR HFA) 108 (90 BASE) MCG/ACT inhaler Inhale 2 puffs into the lungs 4 (four) times daily. 12/29/13   Neena Rhymes, MD  Albuterol Sulfate (PROAIR RESPICLICK) 629 (90 BASE) MCG/ACT AEPB Inhale 2 puffs into the lungs every 4 (four) hours as needed (SOB). 06/21/15   Hoyt Koch, MD  ALPRAZolam Duanne Moron) 0.5 MG tablet Take 1-2 tablets (0.5-1 mg total) by mouth daily as needed (panic attack). 06/21/15   Hoyt Koch, MD  amoxicillin (AMOXIL) 500 MG capsule Take 1 capsule (500 mg total) by mouth 3 (three) times daily. 10/29/15   Billy Fischer, MD  buPROPion [facility]  XL) 300 MG 24 hr tablet Take 1 tablet (300 mg total) by mouth daily. 06/29/15   Hoyt Koch, MD  fexofenadine (ALLEGRA) 180 MG tablet Take 180 mg by mouth daily.    Historical Provider, MD  gabapentin (NEURONTIN) 300 MG capsule Take 300 mg by mouth 2 (two) times daily.    Historical Provider, MD  HYDROcodone-acetaminophen (NORCO/VICODIN) 5-325 MG per tablet Take 1-2 tablets by mouth every 6 (six) hours as needed for moderate pain. Patient not taking: Reported on 06/21/2015 06/07/15   Rozetta Nunnery, MD  hydrocortisone (CORTEF) 5 MG tablet TAKE 2 TABLETS BY MOUTH EVERY MORNING AND 1 TABLET EVERY EVENING NO LATER THAN 6PM 09/10/15   Philemon Kingdom, MD   hydrocortisone sodium succinate (SOLU-CORTEF) 100 MG SOLR injection Inject in the muscle 100 mg as needed if you cannot take the hydrocortisone by mouth 03/22/14   Philemon Kingdom, MD  Melatonin 10 MG TABS Take 1 tablet by mouth at bedtime.    Historical Provider, MD  montelukast (SINGULAIR) 10 MG tablet TAKE ONE TABLET NIGHTLY AT BEDTIME 10/18/15   Hoyt Koch, MD  Olopatadine HCl (PATADAY) 0.2 % SOLN Apply 2 drops to eye every 4 (four) hours as needed. 07/16/15   Hoyt Koch, MD  Suvorexant (BELSOMRA) 15 MG TABS Take 15 mg by mouth at bedtime. 06/21/15   Hoyt Koch, MD  SYRINGE-NEEDLE, DISP, 3 ML (B-D SYRINGE/NEEDLE 3CC/25GX5/8) 25G X 5/8" 3 ML MISC Use as needed 03/22/14   Philemon Kingdom, MD  tretinoin (RETIN-A) 0.1 % cream Apply topically at bedtime. 12/26/13   Neena Rhymes, MD  valACYclovir (VALTREX) 1000 MG tablet Take 1 tablet (1,000 mg total) by mouth 2 (two) times daily. 07/06/15   Hoyt Koch, MD  Water For Inject Bact Parabens (STERILE WATER, BACTERIOSTATIC,) SOLN injection Please dissolve the hydrocortisone solution and inject 100 mg in the muscle as needed 03/22/14   Philemon Kingdom, MD   Meds Ordered and Administered this Visit  Medications - No data to display  BP 108/77 mmHg  Pulse 82  Temp(Src) 98.1 F (36.7 C) (Oral)  Resp 16  SpO2 97%  LMP 10/15/2015 No data found.   Physical Exam  Constitutional: She is oriented to person, place, and time. She appears well-developed and well-nourished.  HENT:  Head: Normocephalic.  Right Ear: External ear normal.  Left Ear: External ear normal.  Mouth/Throat: Uvula is midline and mucous membranes are normal. No uvula swelling. Oropharyngeal exudate, posterior oropharyngeal edema and posterior oropharyngeal erythema present.  Neck: Normal range of motion. Neck supple.  Pulmonary/Chest: Breath sounds normal.  Lymphadenopathy:    She has cervical adenopathy.  Neurological: She is alert and  oriented to person, place, and time.  Skin: Skin is warm and dry.  Nursing note and vitals reviewed.   ED Course  Procedures (including critical care time)  Labs Review Labs Reviewed  POCT RAPID STREP A    Imaging Review No results found.   Visual Acuity Review  Right Eye Distance:   Left Eye Distance:   Bilateral Distance:    Right Eye Near:   Left Eye Near:    Bilateral Near:         MDM   1. Streptococcal sore throat    Meds ordered this encounter  Medications  . amoxicillin (AMOXIL) 500 MG capsule    Sig: Take 1 capsule (500 mg total) by mouth 3 (three) times daily.    Dispense:  30 capsule  Refill:  0       Billy Fischer, MD 10/29/15 2050  Billy Fischer, MD 10/29/15 2112

## 2015-10-29 NOTE — Telephone Encounter (Signed)
Pt states the PA was not approved until she tries Ambien or Lunesta. She says you are aware that she has taken Ambien and the the [place] works best for her.  She says she is willing to try the [facility] for a month to see how it works.  [facility] [address].

## 2015-10-29 NOTE — Discharge Instructions (Signed)
Drink lots of fluids, take all of medicine, use lozenges as needed.return if needed

## 2015-10-30 NOTE — Telephone Encounter (Signed)
Done

## 2015-10-30 NOTE — Telephone Encounter (Signed)
Can we resubmit the PA with accurate information on prior meds which include: Xanax, Ambien, Seroquel.

## 2015-10-31 LAB — CULTURE, GROUP A STREP (THRC)

## 2015-10-31 NOTE — Telephone Encounter (Signed)
Insurance co has called back denying the Belsomra. Because of not trying others. She states you can call [phone] with Ref [facility]

## 2015-11-01 MED ORDER — ESZOPICLONE 2 MG PO TABS: 2.0000 mg | ORAL_TABLET | Freq: Every evening | ORAL | Status: DC | PRN

## 2015-11-01 NOTE — Telephone Encounter (Signed)
Sent in [place] and if fails then belsomra should be approved.

## 2015-11-01 NOTE — Addendum Note (Signed)
Addended by: Pricilla Holm A on: 11/01/2015 04:09 PM   Modules accepted: Orders

## 2015-11-01 NOTE — Telephone Encounter (Signed)
Patient needs patient to try and fail one more sleep aid in order to approve belsomra. Please send in so patient can try.

## 2015-11-07 ENCOUNTER — Other Ambulatory Visit: Payer: Self-pay | Admitting: Internal Medicine

## 2015-11-07 NOTE — Telephone Encounter (Signed)
Please advise if ok to refill. 

## 2015-11-21 ENCOUNTER — Other Ambulatory Visit: Payer: Self-pay | Admitting: Internal Medicine

## 2015-11-27 ENCOUNTER — Other Ambulatory Visit: Payer: Self-pay | Admitting: Internal Medicine

## 2015-11-27 NOTE — Telephone Encounter (Signed)
Faxed to pharmacy

## 2015-12-04 ENCOUNTER — Telehealth: Payer: Self-pay | Admitting: Internal Medicine

## 2015-12-04 MED ORDER — HYDROCORTISONE 5 MG PO TABS: ORAL_TABLET | ORAL | Status: DC

## 2015-12-04 NOTE — Telephone Encounter (Signed)
Pt called to schedule yearly appt, she will be seen in May but she said she needs her hydrocortisone refilled, she has just a couple of days left.

## 2015-12-18 ENCOUNTER — Ambulatory Visit: Payer: 59 | Admitting: Internal Medicine

## 2015-12-26 ENCOUNTER — Ambulatory Visit (INDEPENDENT_AMBULATORY_CARE_PROVIDER_SITE_OTHER): Payer: BLUE CROSS/BLUE SHIELD | Admitting: Sports Medicine

## 2015-12-26 ENCOUNTER — Encounter: Payer: Self-pay | Admitting: Sports Medicine

## 2015-12-26 VITALS — BP 118/64 | HR 55 | Ht 59.0 in | Wt 175.0 lb

## 2015-12-26 DIAGNOSIS — M2021 Hallux rigidus, right foot: Secondary | ICD-10-CM

## 2015-12-26 NOTE — Progress Notes (Signed)
Patient ID: Kathleen Sullivan, female   DOB: 1975-12-05, 40 y.o.   MRN: [id number]  CC: RT great toe pain  Patient was first seen me for hallux rigidus 10 years ago At that time the right great toe was frozen and had very minimal motion The left great toe was also limited  We made her orthotics Started her on some easy motion exercises  she gradually improved her motion and had less pain  Over the past year the right great toe has been getting worse again She does not run any more She has pain with standing one hour or more Does not like to walk more than 1 mile  PAST HX Reviewed with multiple problems and by patient report she also has some adrenal insufficiency that is borderline  Review of systems No heel pain No numbness to the great toe No low back pain with radiation or sciatic She does get some first MTP joint swelling with too much activity or the wrong shoes  Physical examination Mildly obese female in no acute distress BP 118/64 mmHg  Pulse 55  Ht 4' 11"  (1.499 m)  Wt 175 lb (79.379 kg)  BMI 35.33 kg/m2  Right great toe has 10 of extension and 10 of flexion While this is limited is actually an improvement from her last visit Left great toe has about 25 of extension and 30 of flexion  No redness or swelling of the first MTP joint right She can stand on the toes briefly with no real pain Walking gait reveals some dynamic collapse of her long arch on the right but minimal on the left There is mild pronation  Ultrasound There is spurring and a slight effusion of the right first MTP joint The extensor tendon looks normal Left first MTP joint looks normal

## 2015-12-26 NOTE — Assessment & Plan Note (Signed)
She has 3 hallux rigidus stable for about 10 years and actually has slightly better motion Continue passive motion exercises Continue Voltaren gel  Today I added a first ray post and a scaphoid pad to unload the first ray on the right only this gave her some relief of pain Replace this in 3 pairs of shoes  She can also continue using custom orthotics and return here for [place] to evaluate them if they become painful

## 2015-12-31 ENCOUNTER — Other Ambulatory Visit: Payer: Self-pay | Admitting: Internal Medicine

## 2015-12-31 NOTE — Telephone Encounter (Signed)
Please advise if ok to refill pt's med. Pt not seen since 2015.

## 2016-01-25 ENCOUNTER — Other Ambulatory Visit: Payer: Self-pay | Admitting: *Deleted

## 2016-01-25 MED ORDER — ALBUTEROL SULFATE 108 (90 BASE) MCG/ACT IN AEPB: 2.0000 | INHALATION_SPRAY | RESPIRATORY_TRACT | Status: DC | PRN

## 2016-01-25 NOTE — Telephone Encounter (Signed)
Received call pt requesting refill on albuterol inhaler. Sent rx to [facility]...Kathleen Sullivan

## 2016-01-30 ENCOUNTER — Telehealth: Payer: Self-pay | Admitting: Internal Medicine

## 2016-01-30 ENCOUNTER — Other Ambulatory Visit: Payer: Self-pay | Admitting: Internal Medicine

## 2016-01-30 NOTE — Telephone Encounter (Signed)
Pt called and would like for dr Sharlet Salina to call her something for sleep.  Can this be done?    Ph[facility]

## 2016-01-31 NOTE — Telephone Encounter (Signed)
Relation to MV:[facility] Call back [place]: Waynard Edwards, [place] [facility] [address] [phone] (Phone) [phone] (Fax)         Reason for call:   Patient states do to her having a new insurance there requiring a PA for Suvorexant ([place]) 15 MG TABS which she loves. Insurance suggested her taking lunesta, patient states she wants to try it and if it doesn't work at least it will show she exhausted all options as per patient. Please advise

## 2016-01-31 NOTE — Telephone Encounter (Signed)
Please advise if pt needs a follow up appt or is it ok to refill?

## 2016-01-31 NOTE — Telephone Encounter (Signed)
Tried to reach patient, no answer. I need to speak to patient about her current sleep medication. I am confused at to if she wants something new, or a refill of her current sleep med.

## 2016-02-01 NOTE — Telephone Encounter (Signed)
Please advise on lunesta, thanks.

## 2016-02-01 NOTE — Telephone Encounter (Signed)
Patient would like sent today if possible.

## 2016-02-04 NOTE — Telephone Encounter (Signed)
This was ordered on 11/27/15, did she ever fill?

## 2016-02-05 ENCOUNTER — Other Ambulatory Visit: Payer: Self-pay | Admitting: Geriatric Medicine

## 2016-02-05 MED ORDER — ESZOPICLONE 2 MG PO TABS: ORAL_TABLET | ORAL | Status: DC

## 2016-02-05 NOTE — Telephone Encounter (Signed)
Please follow up with patient in regards.

## 2016-02-05 NOTE — Telephone Encounter (Signed)
Left message for patient to call back  

## 2016-02-14 ENCOUNTER — Ambulatory Visit (INDEPENDENT_AMBULATORY_CARE_PROVIDER_SITE_OTHER): Payer: BLUE CROSS/BLUE SHIELD | Admitting: Internal Medicine

## 2016-02-14 ENCOUNTER — Encounter: Payer: Self-pay | Admitting: Internal Medicine

## 2016-02-14 VITALS — BP 102/60 | HR 63 | Temp 98.0°F | Resp 12 | Wt 172.0 lb

## 2016-02-14 DIAGNOSIS — E2749 Other adrenocortical insufficiency: Secondary | ICD-10-CM

## 2016-02-14 MED ORDER — HYDROCORTISONE 5 MG PO TABS: ORAL_TABLET | ORAL | Status: DC

## 2016-02-14 NOTE — Progress Notes (Signed)
Patient ID: Kathleen Sullivan, female   DOB: 06/29/1976, 40 y.o.   MRN: [id number]  Kathleen Sullivan is a 40 y.o. woman initially referred by PCP, Dr Linda Hedges, for evaluation of low body temperature. During investigation for this problem, she was found to have a low cortisol and ACTH, indicative of central (secondary) adrenal insufficiency. Last visit 06/2014.  We started hydrocortisone 10 mg in a.m. and 5 mg in p.m. Right after her dx of Adrenal insufficiency.  Takes the first dose ~8 am and the second dose at ~6 pm >> did not move this to 3 pm as advised as this fits her schedule better.  She feels much better in the last year!. She sleeps better and has more energy.   She stopped Wellbutrin 1 week ago. Does not feel a difference.  She also lost 18 lbs since 7 mo ago!  Reviewed history: Pt described that she started to have a low body temperature in ~2013. She was feeling that she had a fever when her body temp was ~27F. She had cold sweats.    She also c/o - hypomania - worse - decreased memory/concentration - had acne and hirsutism >> has been investigated by Ob[facility]yn (Dr. 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 I initially thought that this could cause her low body temperature however, a cortisol level returned low. A repeat 8 AM cortisol was also low, and associated with a low ACTH, indicative of secondary adrenal insufficiency:  Component     Latest Ref Rng 02/21/2014 03/03/2014  Cortisol - AM     4.3 - 22.4 ug/dL 1.3 (L) 3.2 (L)  C206 ACTH     6 - 50 pg/mL  8  She denied recent steroid use. She had one cortisone inj in  the knee - 8 years ago. She took Percocet (for falling from a tree), now finished. She was not taking it before the cortisol test. She was off the steroid nasal spray long before the test.  No h/o hypothyroidism:  Lab Results  Component Value Date   TSH 1.51 02/21/2014   TSH 1.15 03/16/2009   FREET4 0.84 02/21/2014   The rest of the pituitary w/u was negative: Component     Latest Ref Rng 03/22/2014  Estradiol, Free      2.76  Estradiol      138  Results received      03/29/14  FSH      4.7  LH      5.35  Prolactin      15.7  Somatomedin (IGF-I)     66 - 336 ng/mL 121  Growth Hormone     0.00 - 8.00 ng/mL <0.10   A pituitary MRI (04/18/2014) showed no pituitary or brain pathology   Review of symptoms: Constitutional: + see HPI Eyes: no blurry vision, no xerophthalmia ENT: + sore throat, no nodules palpated in throat, dysphagia/no odynophagia, + hoarseness Cardiovascular: no CP/+ SOB/no palpitations/+ leg swelling Respiratory: + cough/+ SOB/+ wheezing (allergies) Gastrointestinal: no N/V/D/C/+ heartburn Musculoskeletal: + both: muscle/joint aches Skin:+ rash Neurological: no tremors/numbness/tingling/dizziness, +  HA  I reviewed pt's medications, allergies, PMH, social hx, family hx, and changes were documented in the history of present illness. Otherwise, unchanged from my initial visit note.  Physical exam: BP 102/60 mmHg  Pulse 63  Temp(Src) 98 F (36.7 C) (Oral)  Resp 12  Wt 172 lb (78.019 kg)  SpO2 97% Body mass index is 34.72 kg/(m^2).  Wt Readings from Last 3 Encounters:  02/14/16 172 lb (78.019 kg)  12/26/15 175 lb (79.379 kg)  06/21/15 190 lb 12.8 oz (86.546 kg)   Constitutional: obese, in NAD Eyes: PERRLA, EOMI, no exophthalmos ENT: moist mucous membranes, no thyromegaly, no cervical lymphadenopathy Cardiovascular: RRR, No MRG Respiratory: CTA B Gastrointestinal: abdomen soft, NT, ND, BS+ Musculoskeletal: no deformities, strength intact in all  4 Skin: moist, warm, no rashes Neurological: no tremor with outstretched hands, DTR normal in all 4  Assessment: 1. Secondary adrenal insufficiency - likely 2/2 previous steroid inj  Plan: Pt is doing great on the current hydrocortisone regimen, of 10 mg in a.m. and 5 mg in p.m. She has lost almost 20 pounds since last visit, which is wonderful! She did not have to use the injectable hydrocortisone solution since last visit, and she also did not have to double her steroid dose since then. She has had nasal surgery and she has had stress dose steroids in the hospital at that time. The surgery went very well, without problems, and she feels that she can breathe better and sleeping better after this. She has much less fatigue, and feeling or energetic compared to before.  - We discussed about the fact that she has central rather than primary adrenal insufficiency, and decided to check whether her pituitary-adrenal axis recovered. We'll perform a cosyntropin stimulation test for which she will return at 8 in the morning, skipping hydrocortisone the afternoon before and the morning of the test. I advised her to bring the hydrocortisone tablets with her and take them right after we draw blood. Patient Instructions  Please come back for a cosyntropin stimulation test at 8 am. Please take the am hydrocortisone in am the day before, but skip the pm dose the day before and the dose in the morning of the test.  Please return in 1 year.  - We discussed about sick day rules: - the absolute need to take this medication every day and not skip doses. - to double the dose if has a fever, for the duration of the fever. - If cannot take anything by mouth (vomiting) or has severe diarrhea, to make sure that she injects the hydrocortisone in the muscle instead  - I will see her back in one year, however, we may need to repeat the cosyntropin stimulation test at 6 months  Orders Placed This Encounter  Procedures   . Cortisol  . Cortisol  . Cortisol   I will addend the results when they become available.

## 2016-02-14 NOTE — Patient Instructions (Signed)
Please come back for a cosyntropin stimulation test at 8 am. Please take the am hydrocortisone in am the day before, but skip the pm dose the day before and the dose in the morning of the test.  Please return in 1 year.

## 2016-02-20 ENCOUNTER — Other Ambulatory Visit: Payer: BLUE CROSS/BLUE SHIELD

## 2016-03-18 ENCOUNTER — Telehealth: Payer: Self-pay | Admitting: *Deleted

## 2016-03-18 MED ORDER — AZELASTINE-FLUTICASONE 137-50 MCG/ACT NA SUSP: 2.0000 | Freq: Every day | NASAL | Status: DC

## 2016-03-18 NOTE — Telephone Encounter (Signed)
Notified pt with MD response. 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 She haven't had a problem with her allergies in a while then about a week sxs started. She states she been taking the samples and haven't had a problem. She still have the valtrex that md rx just in case...Johny Chess

## 2016-03-18 NOTE — Telephone Encounter (Signed)
Left msg on triage requesting rx for Vynista nasal spray & Asamnex she states her allergies are very bad. Did not see med on med list pls advise...Kathleen Sullivan

## 2016-03-18 NOTE — Telephone Encounter (Signed)
Notified pt with MD

## 2016-03-18 NOTE — Telephone Encounter (Signed)
The asmanex is an allergy medicine and she would need a visit to rx. Dymista sent in for nasal usage for the allergies.

## 2016-04-27 ENCOUNTER — Encounter (HOSPITAL_COMMUNITY): Payer: Self-pay | Admitting: *Deleted

## 2016-04-27 ENCOUNTER — Other Ambulatory Visit: Payer: Self-pay | Admitting: Internal Medicine

## 2016-04-27 ENCOUNTER — Ambulatory Visit (HOSPITAL_COMMUNITY)
Admission: EM | Admit: 2016-04-27 | Discharge: 2016-04-27 | Disposition: A | Discharge status: Home / Self Care | Payer: BLUE CROSS/BLUE SHIELD

## 2016-04-27 ENCOUNTER — Emergency Department (HOSPITAL_COMMUNITY)
Admission: EM | Admit: 2016-04-27 | Discharge: 2016-04-27 | Disposition: A | Discharge status: Home / Self Care | Payer: BLUE CROSS/BLUE SHIELD | Attending: Emergency Medicine | Admitting: Emergency Medicine

## 2016-04-27 ENCOUNTER — Encounter (HOSPITAL_COMMUNITY): Payer: Self-pay

## 2016-04-27 DIAGNOSIS — Z87891 Personal history of nicotine dependence: Secondary | ICD-10-CM | POA: Diagnosis not present

## 2016-04-27 DIAGNOSIS — W57XXXA Bitten or stung by nonvenomous insect and other nonvenomous arthropods, initial encounter: Secondary | ICD-10-CM

## 2016-04-27 DIAGNOSIS — T148 Other injury of unspecified body region: Secondary | ICD-10-CM

## 2016-04-27 DIAGNOSIS — J45909 Unspecified asthma, uncomplicated: Secondary | ICD-10-CM | POA: Diagnosis not present

## 2016-04-27 DIAGNOSIS — R21 Rash and other nonspecific skin eruption: Secondary | ICD-10-CM | POA: Diagnosis not present

## 2016-04-27 MED ORDER — HYDROXYZINE HCL 25 MG PO TABS: 25.0000 mg | ORAL_TABLET | Freq: Four times a day (QID) | ORAL | 0 refills | Status: DC | PRN

## 2016-04-27 MED ORDER — FLUTICASONE PROPIONATE 0.05 % EX CREA: TOPICAL_CREAM | Freq: Two times a day (BID) | CUTANEOUS | 0 refills | Status: DC

## 2016-04-27 NOTE — Discharge Instructions (Signed)
Use cream as needed. Return as needed.

## 2016-04-27 NOTE — ED Provider Notes (Signed)
[facility] DEPT Provider Note   CSN: [id number] Arrival date & time: 04/27/16  1540  First Provider Contact: 04/27/16 5:49 PM    By signing my name below, I, [facility], attest that this documentation has been prepared under the direction and in the presence of Domenic Moras, PA-C. Electronically Signed: Virgel Bouquet, ED Scribe. 04/27/16. 6:04 PM.   History   Chief Complaint Chief Complaint  Patient presents with  . Rash    HPI Kathleen Sullivan is a 40 y.o. female with a hx of adrenal failure who presents to the Emergency Department complaining of constant, moderately painful and pruritic, gradually worsening, burning, erythematous rash onset yesterday. Pt states that she took Diflucan yesterday followed by a rash 2 hours later to her back, BUE, abdomen, chest under her breast, and upper medial thigh and by muscle spasms. She reports blisters in her mouth and subjective fever. She has applied cold compresses with slight relief, taken Tylenol, Valtrex, and Benadryl without relief, and scratched without relief. Per pt, she has had shingles three times in the past. She notes similar symptoms in the past when she took Diflucan, corticosteroids, and when she had shingles. She reports that she was seen at [facility] today where she states that she was told she was bitten by an insect. Denies recent environmental exposure. Denies facial swelling, SOB, wheezing, HA, or trouble swallowing.  The history is provided by the patient. No language interpreter was used.    Past Medical History:  Diagnosis Date  . Adrenal failure ([place])    takes solucortef daily  . Adrenal failure ([place])   . Allergy, unspecified not elsewhere classified   . Anxiety   . Condyloma acuminatum   . Depression   . Esophageal reflux    takes OTC when needed  . Hallux rigidus   . Headache(784.0)   . Mild mitral regurgitation   . Mild tricuspid regurgitation   . Other malignant neoplasm without specification of  site    skin cancer  . Other specified personal history presenting hazards to health(V15.89)    atypical hx of chest pain  . Panic disorder without agoraphobia   . Personal history of unspecified circulatory disease   . Unspecified asthma(493.90)    allergy induced asthma  . Unspecified chronic bronchitis [facility])     Patient Active Problem List   Diagnosis Date Noted  . Secondary adrenal insufficiency ([place]) 03/22/2014  . Acne 12/27/2013  . Routine health maintenance 12/26/2012  . Sleep disorder 11/21/2011  . Hallux rigidus 04/23/2009  . VENEREAL WART 10/15/2007  . CARCINOMA, SQUAMOUS CELL 10/15/2007  . Asthma 10/15/2007  . GASTROESOPHAGEAL REFLUX DISEASE 10/15/2007  . ALLERGY 10/15/2007  . Bipolar 1 disorder, manic, moderate ([place]) 10/01/2007    Past Surgical History:  Procedure Laterality Date  . CESAREAN SECTION    . ESSURE TUBAL LIGATION    . FINGER SURGERY    . NASAL SEPTOPLASTY W/ TURBINOPLASTY N/A 06/07/2015   Procedure: NASAL SEPTOPLASTY WITH TURBINATE REDUCTION ;  Surgeon: Rozetta Nunnery, MD;  Location: [facility];  Service: ENT;  Laterality: N/A;    OB History    No data available       Home Medications    Prior to Admission medications   Medication Sig Start Date End Date Taking? Authorizing Provider  albuterol (PROAIR HFA) 108 (90 BASE) MCG/ACT inhaler Inhale 2 puffs into the lungs 4 (four) times daily. 12/29/13   Neena Rhymes, MD  ALPRAZolam Duanne Moron) 0.5 MG tablet  Take 1-2 tablets (0.5-1 mg total) by mouth daily as needed (panic attack). 06/21/15   Hoyt Koch, MD  amoxicillin (AMOXIL) 500 MG capsule Take 1 capsule (500 mg total) by mouth 3 (three) times daily. 10/29/15   Billy Fischer, MD  Azelastine-Fluticasone [facility]) 137-50 MCG/ACT SUSP Place 2 sprays into the nose daily. 03/18/16   Hoyt Koch, MD  buPROPion (WELLBUTRIN XL) 300 MG 24 hr tablet TAKE 1 TABLET (300 MG TOTAL) BY MOUTH DAILY. 11/21/15   Hoyt Koch, MD  clonazePAM Bobbye Charleston) 1 MG tablet  12/21/15   Historical Provider, MD  eszopiclone (LUNESTA) 2 MG TABS tablet TAKE 1 TABLET (2 MG TOTAL) BY MOUTH AT BEDTIME AS NEEDED FOR SLEEP. TAKE IMMEDIATELY BEFORE BEDTIME. 02/05/16   Hoyt Koch, MD  fexofenadine (ALLEGRA) 180 MG tablet Take 180 mg by mouth daily.    Historical Provider, MD  FLUCONAZOLE PO Take by mouth.    Historical Provider, MD  fluticasone (CUTIVATE) 0.05 % cream Apply topically 2 (two) times daily. 04/27/16   Billy Fischer, MD  gabapentin (NEURONTIN) 300 MG capsule Take 300 mg by mouth 2 (two) times daily.    Historical Provider, MD  HYDROcodone-acetaminophen (NORCO/VICODIN) 5-325 MG per tablet Take 1-2 tablets by mouth every 6 (six) hours as needed for moderate pain. 06/07/15   Rozetta Nunnery, MD  hydrocortisone (CORTEF) 5 MG tablet TAKE 2 TABLETS BY MOUTH EVERY MORNING AND 1 TABLET EVERY EVENING NO LATER THAN 6PM 02/14/16   Philemon Kingdom, MD  hydrocortisone sodium succinate (SOLU-CORTEF) 100 MG SOLR injection Inject in the muscle 100 mg as needed if you cannot take the hydrocortisone by mouth 03/22/14   Philemon Kingdom, MD  LYSINE PO Take by mouth.    Historical Provider, MD  Melatonin 10 MG TABS Take 1 tablet by mouth at bedtime.    Historical Provider, MD  montelukast (SINGULAIR) 10 MG tablet TAKE ONE TABLET NIGHTLY AT BEDTIME 10/18/15   Hoyt Koch, MD  Olopatadine HCl (PATADAY) 0.2 % SOLN Apply 2 drops to eye every 4 (four) hours as needed. 07/16/15   Hoyt Koch, MD  SYRINGE-NEEDLE, DISP, 3 ML (B-D SYRINGE/NEEDLE 3CC/25GX5/8) 25G X 5/8" 3 ML MISC Use as needed 03/22/14   Philemon Kingdom, MD  tretinoin (RETIN-A) 0.1 % cream Apply topically at bedtime. 12/26/13   Neena Rhymes, MD  valACYclovir (VALTREX) 1000 MG tablet Take 1 tablet (1,000 mg total) by mouth 2 (two) times daily. 07/06/15   Hoyt Koch, MD  Water For Inject Bact Parabens (STERILE WATER, BACTERIOSTATIC,) SOLN injection  Please dissolve the hydrocortisone solution and inject 100 mg in the muscle as needed 03/22/14   Philemon Kingdom, MD    Family History Family History  Problem Relation Age of Onset  . Breast cancer Mother   . Uterine cancer Mother   . Mental illness Mother   . Anxiety disorder Mother   . Diabetes Brother   . Breast cancer Other   . Coronary artery disease Other   . Stroke Other   . Diabetes Other   . Hypertension Other   . Colon cancer Neg Hx     Social History Social History  Substance Use Topics  . Smoking status: Former Research scientist (life sciences)  . Smokeless tobacco: Never Used     Comment: >15 yrs ago.  . Alcohol use Yes     Comment: occasional     Allergies   Metoclopramide hcl and Tuna [fish allergy]   Review of  Systems Review of Systems  Constitutional: Positive for fever.  HENT: Positive for mouth sores. Negative for facial swelling and trouble swallowing.   Respiratory: Negative for shortness of breath and wheezing.   Skin: Positive for color change and rash.  Neurological: Negative for headaches.     Physical Exam Updated Vital Signs BP 122/80 (BP Location: Right Arm)   Pulse 63   Temp 98.3 F (36.8 C) (Oral)   Resp 18   LMP  (Approximate) Comment: 2 wks ago  SpO2 98%   Physical Exam  Constitutional: She is oriented to person, place, and time. She appears well-developed and well-nourished. No distress.  HENT:  Head: Normocephalic and atraumatic.  No oral mucosal rash or blisters  Eyes: Conjunctivae are normal.  Neck: Normal range of motion.  Cardiovascular: Normal rate.   Pulmonary/Chest: Effort normal. No respiratory distress.  Musculoskeletal: Normal range of motion.  Neurological: She is alert and oriented to person, place, and time.  Skin: Skin is warm and dry.  Multiple patchy erythematous macular lesions noted throughout body ranging from 1-12cm without vesicle/pustular/petechiae lesions.  blanchable  Psychiatric: She has a normal mood and affect. Her  behavior is normal.  Nursing note and vitals reviewed.    ED Treatments / Results   DIAGNOSTIC STUDIES: Oxygen Saturation is 98% on RA, normal by my interpretation.    COORDINATION OF CARE: 5:49 PM pt here with a non specific macular erythematous rash throughout her body. No desquamation of skin. This could be allergic reaction vs. Disseminated shingle vs. Potential SJS/TEN.  She report similar rash each time she take Diflucan, therefore suspect allergy.  Recommend avoid taking Diflucan.  No hypotension.  Pt is well appearing.  Advised pt to continue to take Benadryl and Valtrex and monitor rash at home. Advised pt to return to the ED if symptoms worsen. Advised pt to consult and follow-up with endocrinologist. Discussed treatment plan with pt at bedside and pt agreed to plan.  She has hx of adrenal insufficiency, therefore I will not prescribe steroid at this time.  I also discuss option of admission for obs.  Pt prefers f/u with pcp and close monitoring at home.  She understand to return if her condition worsen.    Procedures Procedures   Medications Ordered in ED Medications - No data to display   Initial Impression / Assessment and Plan / ED Course  I have reviewed the triage vital signs and the nursing notes.  Pertinent labs & imaging results that were available during my care of the patient were reviewed by me and considered in my medical decision making (see chart for details).  Clinical Course    BP 122/80 (BP Location: Right Arm)   Pulse 63   Temp 98.3 F (36.8 C) (Oral)   Resp 18   LMP  (Approximate) Comment: 2 wks ago  SpO2 98%    Final Clinical Impressions(s) / ED Diagnoses   Final diagnoses:  Rash    New Prescriptions New Prescriptions   No medications on file   I personally performed the services described in this documentation, which was scribed in my presence. The recorded information has been reviewed and is accurate.               Domenic Moras, PA-C 04/27/16 [place], PA-C 04/27/16 Alcorn Nguyen, MD 04/28/16 [phone]

## 2016-04-27 NOTE — ED Triage Notes (Addendum)
Patient here with several red areas over body since Friday after taking diflucan. Thinks she has shingles, states the areas of redness are itchy and painful, no draining. Areas to bilateral sides of lower abdomen, back, groin.

## 2016-04-27 NOTE — ED Triage Notes (Signed)
Woke up yesterday AM with fever, "shingles rash" to various areas of body, some intermittent tremors, significant pain and pruritis.  Immediately started Valtrex, lysine, Benadryl, Tylenol, and applying warm compresses.  Also took Diflucan 2 days ago for vaginal yeast infection following abx course for dental work last week.

## 2016-04-27 NOTE — ED Provider Notes (Signed)
[place]    CSN: [id number] Arrival date & time: 04/27/16  1411  First Provider Contact:  First MD Initiated Contact with Patient 04/27/16 1458        History   Chief Complaint Chief Complaint  Patient presents with  . Rash    HPI Kathleen Sullivan is a 40 y.o. female.    Rash  Location:  Full body Quality: itchiness and redness   Severity:  Moderate Onset quality:  Gradual Duration:  2 days Progression:  Unchanged Chronicity:  New Context: insect bite/sting   Relieved by:  None tried Worsened by:  Nothing Ineffective treatments:  None tried Associated symptoms: no abdominal pain, no fever and no nausea     Past Medical History:  Diagnosis Date  . Adrenal failure ([place])    takes solucortef daily  . Adrenal failure ([place])   . Allergy, unspecified not elsewhere classified   . Anxiety   . Condyloma acuminatum   . Depression   . Esophageal reflux    takes OTC when needed  . Hallux rigidus   . Headache(784.0)   . Mild mitral regurgitation   . Mild tricuspid regurgitation   . Other malignant neoplasm without specification of site    skin cancer  . Other specified personal history presenting hazards to health(V15.89)    atypical hx of chest pain  . Panic disorder without agoraphobia   . Personal history of unspecified circulatory disease   . Unspecified asthma(493.90)    allergy induced asthma  . Unspecified chronic bronchitis [facility])     Patient Active Problem List   Diagnosis Date Noted  . 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 Acne 12/27/2013  . Routine health maintenance 12/26/2012  . Sleep disorder 11/21/2011  . Hallux rigidus 04/23/2009  . VENEREAL WART 10/15/2007  . CARCINOMA, SQUAMOUS CELL 10/15/2007  . Asthma 10/15/2007  . GASTROESOPHAGEAL REFLUX DISEASE 10/15/2007  . ALLERGY 10/15/2007  . Bipolar 1 disorder, manic, moderate ([place]) 10/01/2007    Past Surgical History:  Procedure Laterality Date  . CESAREAN  SECTION    . ESSURE TUBAL LIGATION    . FINGER SURGERY    . NASAL SEPTOPLASTY W/ TURBINOPLASTY N/A 06/07/2015   Procedure: NASAL SEPTOPLASTY WITH TURBINATE REDUCTION ;  Surgeon: Rozetta Nunnery, MD;  Location: [place];  Service: ENT;  Laterality: N/A;    OB History    No data available       Home Medications    Prior to Admission medications   Medication Sig Start Date End Date Taking? Authorizing Provider  fexofenadine (ALLEGRA) 180 MG tablet Take 180 mg by mouth daily.   Yes Historical Provider, MD  FLUCONAZOLE PO Take by mouth.   Yes Historical Provider, MD  gabapentin (NEURONTIN) 300 MG capsule Take 300 mg by mouth 2 (two) times daily.   Yes Historical Provider, MD  hydrocortisone (CORTEF) 5 MG tablet TAKE 2 TABLETS BY MOUTH EVERY MORNING AND 1 TABLET EVERY EVENING NO LATER THAN 6PM 02/14/16  Yes Philemon Kingdom, MD  LYSINE PO Take by mouth.   Yes Historical Provider, MD  montelukast (SINGULAIR) 10 MG tablet TAKE ONE TABLET NIGHTLY AT BEDTIME 10/18/15  Yes Hoyt Koch, MD  valACYclovir (VALTREX) 1000 MG tablet Take 1 tablet (1,000 mg total) by mouth 2 (two) times daily. 07/06/15  Yes Hoyt Koch, MD  albuterol (PROAIR HFA) 108 (90 BASE) MCG/ACT inhaler Inhale 2 puffs into the lungs 4 (four) times daily. 12/29/13   Legrand Como  E Norins, MD  ALPRAZolam (XANAX) 0.5 MG tablet Take 1-2 tablets (0.5-1 mg total) by mouth daily as needed (panic attack). 06/21/15   Hoyt Koch, MD  amoxicillin (AMOXIL) 500 MG capsule Take 1 capsule (500 mg total) by mouth 3 (three) times daily. 10/29/15   Billy Fischer, MD  Azelastine-Fluticasone [facility]) 137-50 MCG/ACT SUSP Place 2 sprays into the nose daily. 03/18/16   Hoyt Koch, MD  buPROPion (WELLBUTRIN XL) 300 MG 24 hr tablet TAKE 1 TABLET (300 MG TOTAL) BY MOUTH DAILY. 11/21/15   Hoyt Koch, MD  clonazePAM [facility]) 1 MG tablet  12/21/15   Historical Provider, MD  eszopiclone (LUNESTA) 2 MG  TABS tablet TAKE 1 TABLET (2 MG TOTAL) BY MOUTH AT BEDTIME AS NEEDED FOR SLEEP. TAKE IMMEDIATELY BEFORE BEDTIME. 02/05/16   Hoyt Koch, MD  fluticasone (CUTIVATE) 0.05 % cream Apply topically 2 (two) times daily. 04/27/16   Billy Fischer, MD  HYDROcodone-acetaminophen (NORCO/VICODIN) 5-325 MG per tablet Take 1-2 tablets by mouth every 6 (six) hours as needed for moderate pain. 06/07/15   Rozetta Nunnery, MD  hydrocortisone sodium succinate (SOLU-CORTEF) 100 MG SOLR injection Inject in the muscle 100 mg as needed if you cannot take the hydrocortisone by mouth 03/22/14   Philemon Kingdom, MD  Melatonin 10 MG TABS Take 1 tablet by mouth at bedtime.    Historical Provider, MD  Olopatadine HCl (PATADAY) 0.2 % SOLN Apply 2 drops to eye every 4 (four) hours as needed. 07/16/15   Hoyt Koch, MD  SYRINGE-NEEDLE, DISP, 3 ML (B-D SYRINGE/NEEDLE 3CC/25GX5/8) 25G X 5/8" 3 ML MISC Use as needed 03/22/14   Philemon Kingdom, MD  tretinoin (RETIN-A) 0.1 % cream Apply topically at bedtime. 12/26/13   Neena Rhymes, MD  Water For Inject Bact Parabens (STERILE WATER, BACTERIOSTATIC,) SOLN injection Please dissolve the hydrocortisone solution and inject 100 mg in the muscle as needed 03/22/14   Philemon Kingdom, MD    Family History Family History  Problem Relation Age of Onset  . Breast cancer Mother   . Uterine cancer Mother   . Mental illness Mother   . Anxiety disorder Mother   . Diabetes Brother   . Breast cancer Other   . Coronary artery disease Other   . Stroke Other   . Diabetes Other   . Hypertension Other   . Colon cancer Neg Hx     Social History Social History  Substance Use Topics  . Smoking status: Former Research scientist (life sciences)  . Smokeless tobacco: Never Used     Comment: >15 yrs ago.  . Alcohol use Yes     Comment: occasional     Allergies   Metoclopramide hcl and Tuna [fish allergy]   Review of Systems Review of Systems  Constitutional: Negative.  Negative for chills and  fever.  HENT: Negative.   Respiratory: Negative.   Gastrointestinal: Negative for abdominal pain and nausea.  Skin: Positive for rash.  All other systems reviewed and are negative.    Physical Exam Triage Vital Signs ED Triage Vitals  Enc Vitals Group     BP 04/27/16 1458 117/65     Pulse Rate 04/27/16 1458 78     Resp 04/27/16 1458 20     Temp 04/27/16 1458 98.3 F (36.8 C)     Temp Source 04/27/16 1458 Oral     SpO2 04/27/16 1458 97 %     Weight --      Height --  Head Circumference --      Peak Flow --      Pain Score 04/27/16 1508 8     Pain Loc --      Pain Edu? --      Excl. in [place]? --    No data found.   Updated Vital Signs BP 117/65   Pulse 78   Temp 98.3 F (36.8 C) (Oral)   Resp 20   LMP  (Approximate) Comment: 2 wks ago  SpO2 97%   Visual Acuity Right Eye Distance:   Left Eye Distance:   Bilateral Distance:    Right Eye Near:   Left Eye Near:    Bilateral Near:     Physical Exam  Constitutional: She is oriented to person, place, and time. She appears well-developed and well-nourished. No distress.  HENT:  Mouth/Throat: Oropharynx is clear and moist.  Pulmonary/Chest: Effort normal and breath sounds normal.  Musculoskeletal: Normal range of motion.  Neurological: She is alert and oriented to person, place, and time.  Skin: Skin is warm and dry. Rash noted. There is erythema.  Scattered erythematous lesions , variable size with central pale bite site blister, nonvesicular, nontender, nonpustular.  Psychiatric: She has a normal mood and affect.  Nursing note and vitals reviewed.    UC Treatments / Results  Labs (all labs ordered are listed, but only abnormal results are displayed) Labs Reviewed - No data to display  EKG  EKG Interpretation None       Radiology No results found.  Procedures Procedures (including critical care time)  Medications Ordered in UC Medications - No data to display   Initial Impression /  Assessment and Plan / UC Course  I have reviewed the triage vital signs and the nursing notes.  Pertinent labs & imaging results that were available during my care of the patient were reviewed by me and considered in my medical decision making (see chart for details).  Clinical Course      Final Clinical Impressions(s) / UC Diagnoses   Final diagnoses:  Multiple insect bites    New Prescriptions New Prescriptions   FLUTICASONE (CUTIVATE) 0.05 % CREAM    Apply topically 2 (two) times daily.     Billy Fischer, MD 04/27/16 [phone]

## 2016-04-27 NOTE — Discharge Instructions (Signed)
Please continue taking Valtrex 1g 3 times daily for the next 5 days.  Take benadryl as needed for itch.  Follow up with your endocrinologist and your primary care provider tomorrow for further management.  Return in 48 hrs if you notice worsening of your symptoms such as fever, headache, blister in mouth, tongue swelling, difficulty breathing, lightheadedness or dizziness.

## 2016-04-28 ENCOUNTER — Telehealth: Payer: Self-pay | Admitting: Internal Medicine

## 2016-04-28 NOTE — Telephone Encounter (Signed)
Patient has shingles again,she has it more than one side of her body, her doctor said it was dangerous. patient is asking if she can take more medication Hydrocortisone, please advise

## 2016-04-28 NOTE — Telephone Encounter (Signed)
SPOKE TO PT

## 2016-04-28 NOTE — Telephone Encounter (Signed)
She can take more steroid under the supervision of her PCP.

## 2016-04-28 NOTE — Telephone Encounter (Signed)
Sent to pharmacy 

## 2016-05-21 ENCOUNTER — Other Ambulatory Visit (HOSPITAL_COMMUNITY): Payer: Self-pay | Admitting: Obstetrics and Gynecology

## 2016-05-21 DIAGNOSIS — Z9851 Tubal ligation status: Secondary | ICD-10-CM

## 2016-05-26 ENCOUNTER — Other Ambulatory Visit: Payer: Self-pay | Admitting: Obstetrics and Gynecology

## 2016-05-26 DIAGNOSIS — R928 Other abnormal and inconclusive findings on diagnostic imaging of breast: Secondary | ICD-10-CM

## 2016-05-28 ENCOUNTER — Ambulatory Visit (HOSPITAL_COMMUNITY)
Admission: RE | Admit: 2016-05-28 | Discharge: 2016-05-28 | Disposition: A | Discharge status: Home / Self Care | Payer: BLUE CROSS/BLUE SHIELD | Source: Ambulatory Visit | Attending: Obstetrics and Gynecology | Admitting: Obstetrics and Gynecology

## 2016-05-28 DIAGNOSIS — Z9851 Tubal ligation status: Secondary | ICD-10-CM

## 2016-05-28 DIAGNOSIS — Z3049 Encounter for surveillance of other contraceptives: Secondary | ICD-10-CM | POA: Diagnosis not present

## 2016-05-28 MED ORDER — IOPAMIDOL (ISOVUE-300) INJECTION 61%
30.0000 mL | Freq: Once | INTRAVENOUS | Status: AC | PRN
  Administered 2016-05-28: 4 mL

## 2016-06-03 ENCOUNTER — Ambulatory Visit
Admission: RE | Admit: 2016-06-03 | Discharge: 2016-06-03 | Disposition: A | Discharge status: Home / Self Care | Payer: BLUE CROSS/BLUE SHIELD | Source: Ambulatory Visit | Attending: Obstetrics and Gynecology | Admitting: Obstetrics and Gynecology

## 2016-06-03 DIAGNOSIS — R928 Other abnormal and inconclusive findings on diagnostic imaging of breast: Secondary | ICD-10-CM

## 2016-06-19 ENCOUNTER — Encounter: Payer: Self-pay | Admitting: Internal Medicine

## 2016-06-19 NOTE — Progress Notes (Signed)
Patient is cleared for surgery from her adrenal insufficiency point of view.  I would recommend 50 mg iv hydrocortisone every 6 hours in the day of the surgery and return to her home hydrocortisone regimen the next day or the first day that she can take po: 10 mg in am and 5 mg in pm.  Philemon Kingdom, MD PhD [facility]

## 2016-07-24 ENCOUNTER — Other Ambulatory Visit: Payer: Self-pay | Admitting: Internal Medicine

## 2016-07-28 ENCOUNTER — Other Ambulatory Visit: Payer: Self-pay | Admitting: Internal Medicine

## 2016-07-30 ENCOUNTER — Telehealth: Payer: Self-pay | Admitting: Geriatric Medicine

## 2016-07-30 MED ORDER — ESZOPICLONE 2 MG PO TABS: 2.0000 mg | ORAL_TABLET | Freq: Every evening | ORAL | 0 refills | Status: DC | PRN

## 2016-07-30 NOTE — Telephone Encounter (Signed)
Printed 30 day supply and needs visit for any more refills as >1 year from last visit.

## 2016-07-30 NOTE — Telephone Encounter (Signed)
Faxed to pharmacy

## 2016-07-30 NOTE — Addendum Note (Signed)
Addended by: Pricilla Holm A on: 07/30/2016 09:39 AM   Modules accepted: Orders

## 2016-07-30 NOTE — Telephone Encounter (Signed)
Patient called in.  Got her scheduled.

## 2016-07-30 NOTE — Telephone Encounter (Signed)
Patient would like a refill on her eszopiclone 2 mg. Please advise, thanks.

## 2016-08-01 ENCOUNTER — Telehealth: Payer: Self-pay

## 2016-08-01 ENCOUNTER — Telehealth: Payer: Self-pay | Admitting: Internal Medicine

## 2016-08-01 NOTE — Telephone Encounter (Signed)
Sent labs, and last visit to fax number provided to surgery center. Will await confirmation.

## 2016-08-01 NOTE — Telephone Encounter (Signed)
Surgical center, called and need patient most recent office notes and labs.Fax # [phone]  Phone #  [phone] ex [phone]

## 2016-08-25 ENCOUNTER — Encounter: Payer: Self-pay | Admitting: Internal Medicine

## 2016-08-25 ENCOUNTER — Ambulatory Visit (INDEPENDENT_AMBULATORY_CARE_PROVIDER_SITE_OTHER): Payer: BLUE CROSS/BLUE SHIELD | Admitting: Internal Medicine

## 2016-08-25 ENCOUNTER — Other Ambulatory Visit (INDEPENDENT_AMBULATORY_CARE_PROVIDER_SITE_OTHER): Payer: BLUE CROSS/BLUE SHIELD

## 2016-08-25 VITALS — BP 138/82 | HR 98 | Temp 98.7°F | Resp 18 | Ht 59.0 in | Wt 168.4 lb

## 2016-08-25 DIAGNOSIS — E2749 Other adrenocortical insufficiency: Secondary | ICD-10-CM | POA: Diagnosis not present

## 2016-08-25 DIAGNOSIS — G479 Sleep disorder, unspecified: Secondary | ICD-10-CM

## 2016-08-25 DIAGNOSIS — Z0000 Encounter for general adult medical examination without abnormal findings: Secondary | ICD-10-CM

## 2016-08-25 DIAGNOSIS — R7989 Other specified abnormal findings of blood chemistry: Secondary | ICD-10-CM | POA: Diagnosis not present

## 2016-08-25 DIAGNOSIS — J4520 Mild intermittent asthma, uncomplicated: Secondary | ICD-10-CM | POA: Diagnosis not present

## 2016-08-25 LAB — COMPREHENSIVE METABOLIC PANEL
ALBUMIN: 4.6 g/dL (ref 3.5–5.2)
ALK PHOS: 54 U/L (ref 39–117)
ALT: 12 U/L (ref 0–35)
AST: 14 U/L (ref 0–37)
BUN: 7 mg/dL (ref 6–23)
CO2: 24 mEq/L (ref 19–32)
CREATININE: 0.7 mg/dL (ref 0.40–1.20)
Calcium: 9.7 mg/dL (ref 8.4–10.5)
Chloride: 104 mEq/L (ref 96–112)
GFR: 98.1 mL/min (ref >60.00)
Glucose, Bld: 87 mg/dL (ref 70–99)
POTASSIUM: 3.5 meq/L (ref 3.5–5.1)
SODIUM: 138 meq/L (ref 135–145)
TOTAL PROTEIN: 7.7 g/dL (ref 6.0–8.3)
Total Bilirubin: 0.8 mg/dL (ref 0.2–1.2)

## 2016-08-25 LAB — LIPID PANEL
CHOLESTEROL: 217 mg/dL — AB (ref 0–200)
HDL: 45.3 mg/dL (ref >39.00)
NonHDL: 171.99
TRIGLYCERIDES: 272 mg/dL — AB (ref 0.0–149.0)
Total CHOL/HDL Ratio: 5
VLDL: 54.4 mg/dL — ABNORMAL HIGH (ref 0.0–40.0)

## 2016-08-25 LAB — CBC
HCT: 47 % — ABNORMAL HIGH (ref 36.0–46.0)
HEMOGLOBIN: 16.4 g/dL — AB (ref 12.0–15.0)
MCHC: 35 g/dL (ref 30.0–36.0)
MCV: 88.9 fl (ref 78.0–100.0)
Platelets: 292 10*3/uL (ref 150.0–400.0)
RBC: 5.28 Mil/uL — ABNORMAL HIGH (ref 3.87–5.11)
RDW: 13.1 % (ref 11.5–15.5)
WBC: 10.6 10*3/uL — AB (ref 4.0–10.5)

## 2016-08-25 LAB — HEMOGLOBIN A1C: HEMOGLOBIN A1C: 5 % (ref 4.6–6.5)

## 2016-08-25 LAB — LDL CHOLESTEROL, DIRECT: LDL DIRECT: 149 mg/dL

## 2016-08-25 MED ORDER — OLOPATADINE HCL 0.2 % OP SOLN: 2.0000 [drp] | OPHTHALMIC | 11 refills | Status: DC | PRN

## 2016-08-25 MED ORDER — MONTELUKAST SODIUM 10 MG PO TABS: ORAL_TABLET | ORAL | 3 refills | Status: DC

## 2016-08-25 MED ORDER — VALACYCLOVIR HCL 1 G PO TABS: 1000.0000 mg | ORAL_TABLET | Freq: Two times a day (BID) | ORAL | 3 refills | Status: DC

## 2016-08-25 MED ORDER — ALPRAZOLAM 0.5 MG PO TABS: 0.5000 mg | ORAL_TABLET | Freq: Every day | ORAL | 3 refills | Status: DC | PRN

## 2016-08-25 MED ORDER — ALBUTEROL SULFATE HFA 108 (90 BASE) MCG/ACT IN AERS: 2.0000 | INHALATION_SPRAY | Freq: Four times a day (QID) | RESPIRATORY_TRACT | 6 refills | Status: DC

## 2016-08-25 MED ORDER — SUVOREXANT 15 MG PO TABS: 15.0000 mg | ORAL_TABLET | Freq: Every day | ORAL | 5 refills | Status: DC

## 2016-08-25 NOTE — Assessment & Plan Note (Signed)
Has failed lunesta and xanax and ambien and trazodone over the years. Rx for belsomra which has worked well for her and is the only agent which has worked for her.

## 2016-08-25 NOTE — Progress Notes (Signed)
   Subjective:    Patient ID: Kathleen Sullivan, female    DOB: 11/10/1975, 40 y.o.   MRN: [id number]  HPI The patient is a 40 YO female coming in for wellness. Recent knee surgery about 2 weeks ago. Did not ever go for stim test with endo about her adrenal.   PMH, [place], social history reviewed and updated.   Review of Systems  Constitutional: Positive for activity change, appetite change and fever. Negative for chills and unexpected weight change.  HENT: Negative.   Eyes: Negative.   Respiratory: Negative for cough, chest tightness and shortness of breath.   Cardiovascular: Negative for chest pain, palpitations and leg swelling.  Gastrointestinal: Negative for abdominal distention, abdominal pain, constipation, diarrhea, nausea and vomiting.  Musculoskeletal: Positive for arthralgias. Negative for back pain, gait problem, joint swelling and myalgias.  Skin: Negative.   Neurological: Negative.   Psychiatric/Behavioral: Positive for sleep disturbance. Negative for confusion, decreased concentration, dysphoric mood, self-injury and suicidal ideas. The patient is nervous/anxious.       Objective:   Physical Exam  Constitutional: She is oriented to person, place, and time. She appears well-developed and well-nourished.  Overweight  HENT:  Head: Normocephalic and atraumatic.  Right Ear: External ear normal.  Left Ear: External ear normal.  Nose: Nose normal.  Mouth/Throat: Oropharynx is clear and moist.  Eyes: EOM are normal.  Neck: Normal range of motion.  Cardiovascular: Normal rate and regular rhythm.   Pulmonary/Chest: Effort normal and breath sounds normal. No respiratory distress. She has no wheezes. She has no rales.  Abdominal: Soft. Bowel sounds are normal. She exhibits no distension. There is no tenderness. There is no rebound.  Musculoskeletal: She exhibits no edema.  Incision on the left knee covered with steri strips and appears intact and healing appropriately.     Lymphadenopathy:    She has no cervical adenopathy.  Neurological: She is alert and oriented to person, place, and time. Coordination normal.  Skin: Skin is warm and dry.  Psychiatric: She has a normal mood and affect.   Vitals:   08/25/16 0903  BP: 138/82  Pulse: 98  Resp: 18  Temp: 98.7 F (37.1 C)  TempSrc: Oral  SpO2: 98%  Weight: 168 lb 6.4 oz (76.4 kg)  Height: 4' 11"  (1.499 m)      Assessment & Plan:

## 2016-08-25 NOTE — Assessment & Plan Note (Signed)
Using albuterol prn and still does not need controlled medication. Does take steroids daily for other reason.

## 2016-08-25 NOTE — Progress Notes (Signed)
Pre visit review using our clinic review tool, if applicable. No additional management support is needed unless otherwise documented below in the visit note. 

## 2016-08-25 NOTE — Assessment & Plan Note (Signed)
Colonoscopy due at 32. Cannot take flu shot today for her perception of fevers (took tylenol 3 hours prior to visit so we could not verify). Will get in a couple of weeks. Declines need for hiv screening. Given screening recommendations.

## 2016-08-25 NOTE — Assessment & Plan Note (Signed)
She is reminded that it is very important to follow all instructions from her endocrinologist and asked to do the stimulation test and call to do that.

## 2016-08-25 NOTE — Patient Instructions (Signed)
We will check the labs and call you back with the results.   You do need to do the stimulation test to see if the adrenal glands are recovering at all with the endocrine doctor so call them to do this,  Health Maintenance, Female Introduction Adopting a healthy lifestyle and getting preventive care can go a long way to promote health and wellness. Talk with your health care provider about what schedule of regular examinations is right for you. This is a good chance for you to check in with your provider about disease prevention and staying healthy. In between checkups, there are plenty of things you can do on your own. Experts have done a lot of research about which lifestyle changes and preventive measures are most likely to keep you healthy. Ask your health care provider for more information. Weight and diet Eat a healthy diet  Be sure to include plenty of vegetables, fruits, low-fat dairy products, and lean protein.  Do not eat a lot of foods high in solid fats, added sugars, or salt.  Get regular exercise. This is one of the most important things you can do for your health.  Most adults should exercise for at least 150 minutes each week. The exercise should increase your heart rate and make you sweat (moderate-intensity exercise).  Most adults should also do strengthening exercises at least twice a week. This is in addition to the moderate-intensity exercise. Maintain a healthy weight  Body mass index (BMI) is a measurement that can be used to identify possible weight problems. It estimates body fat based on height and weight. Your health care provider can help determine your BMI and help you achieve or maintain a healthy weight.  For females 81 years of age and older:  A BMI below 18.5 is considered underweight.  A BMI of 18.5 to 24.9 is normal.  A BMI of 25 to 29.9 is considered overweight.  A BMI of 30 and above is considered obese. Watch levels of cholesterol and blood  lipids  You should start having your blood tested for lipids and cholesterol at 40 years of age, then have this test every 5 years.  You may need to have your cholesterol levels checked more often if:  Your lipid or cholesterol levels are high.  You are older than 40 years of age.  You are at high risk for heart disease. Cancer screening Lung Cancer  Lung cancer screening is recommended for adults 81-60 years old who are at high risk for lung cancer because of a history of smoking.  A yearly low-dose CT scan of the lungs is recommended for people who:  Currently smoke.  Have quit within the past 15 years.  Have at least a 30-pack-year history of smoking. A pack year is smoking an average of one pack of cigarettes a day for 1 year.  Yearly screening should continue until it has been 15 years since you quit.  Yearly screening should stop if you develop a health problem that would prevent you from having lung cancer treatment. Breast Cancer  Practice breast self-awareness. This means understanding how your breasts normally appear and feel.  It also means doing regular breast self-exams. Let your health care provider know about any changes, no matter how small.  If you are in your 20s or 30s, you should have a clinical breast exam (CBE) by a health care provider every 1-3 years as part of a regular health exam.  If you are 40 or older,  have a CBE every year. Also consider having a breast X-ray (mammogram) every year.  If you have a family history of breast cancer, talk to your health care provider about genetic screening.  If you are at high risk for breast cancer, talk to your health care provider about having an MRI and a mammogram every year.  Breast cancer gene (BRCA) assessment is recommended for women who have family members with BRCA-related cancers. BRCA-related cancers include:  Breast.  Ovarian.  Tubal.  Peritoneal cancers.  Results of the assessment will  determine the need for genetic counseling and BRCA1 and BRCA2 testing. Cervical Cancer  Your health care provider may recommend that you be screened regularly for cancer of the pelvic organs (ovaries, uterus, and vagina). This screening involves a pelvic examination, including checking for microscopic changes to the surface of your cervix (Pap test). You may be encouraged to have this screening done every 3 years, beginning at age 49.  For women ages 13-65, health care providers may recommend pelvic exams and Pap testing every 3 years, or they may recommend the Pap and pelvic exam, combined with testing for human papilloma virus (HPV), every 5 years. Some types of HPV increase your risk of cervical cancer. Testing for HPV may also be done on women of any age with unclear Pap test results.  Other health care providers may not recommend any screening for nonpregnant women who are considered low risk for pelvic cancer and who do not have symptoms. Ask your health care provider if a screening pelvic exam is right for you.  If you have had past treatment for cervical cancer or a condition that could lead to cancer, you need Pap tests and screening for cancer for at least 20 years after your treatment. If Pap tests have been discontinued, your risk factors (such as having a new sexual partner) need to be reassessed to determine if screening should resume. Some women have medical problems that increase the chance of getting cervical cancer. In these cases, your health care provider may recommend more frequent screening and Pap tests. Colorectal Cancer  This type of cancer can be detected and often prevented.  Routine colorectal cancer screening usually begins at 40 years of age and continues through 40 years of age.  Your health care provider may recommend screening at an earlier age if you have risk factors for colon cancer.  Your health care provider may also recommend using home test kits to check for  hidden blood in the stool.  A small camera at the end of a tube can be used to examine your colon directly (sigmoidoscopy or colonoscopy). This is done to check for the earliest forms of colorectal cancer.  Routine screening usually begins at age 54.  Direct examination of the colon should be repeated every 5-10 years through 40 years of age. However, you may need to be screened more often if early forms of precancerous polyps or small growths are found. Skin Cancer  Check your skin from head to toe regularly.  Tell your health care provider about any new moles or changes in moles, especially if there is a change in a mole's shape or color.  Also tell your health care provider if you have a mole that is larger than the size of a pencil eraser.  Always use sunscreen. Apply sunscreen liberally and repeatedly throughout the day.  Protect yourself by wearing long sleeves, pants, a wide-brimmed hat, and sunglasses whenever you are outside. Heart disease, diabetes,  and high blood pressure  High blood pressure causes heart disease and increases the risk of stroke. High blood pressure is more likely to develop in:  People who have blood pressure in the high end of the normal range (130-139/85-89 mm Hg).  People who are overweight or obese.  People who are African American.  If you are 34-32 years of age, have your blood pressure checked every 3-5 years. If you are 61 years of age or older, have your blood pressure checked every year. You should have your blood pressure measured twice-once when you are at a hospital or clinic, and once when you are not at a hospital or clinic. Record the average of the two measurements. To check your blood pressure when you are not at a hospital or clinic, you can use:  An automated blood pressure machine at a pharmacy.  A home blood pressure monitor.  If you are between 26 years and 30 years old, ask your health care provider if you should take aspirin to  prevent strokes.  Have regular diabetes screenings. This involves taking a blood sample to check your fasting blood sugar level.  If you are at a normal weight and have a low risk for diabetes, have this test once every three years after 40 years of age.  If you are overweight and have a high risk for diabetes, consider being tested at a younger age or more often. Preventing infection Hepatitis B  If you have a higher risk for hepatitis B, you should be screened for this virus. You are considered at high risk for hepatitis B if:  You were born in a country where hepatitis B is common. Ask your health care provider which countries are considered high risk.  Your parents were born in a high-risk country, and you have not been immunized against hepatitis B (hepatitis B vaccine).  You have HIV or AIDS.  You use needles to inject street drugs.  You live with someone who has hepatitis B.  You have had sex with someone who has hepatitis B.  You get hemodialysis treatment.  You take certain medicines for conditions, including cancer, organ transplantation, and autoimmune conditions. Hepatitis C  Blood testing is recommended for:  Everyone born from [birth year] through 1965.  Anyone with known risk factors for hepatitis C. Sexually transmitted infections (STIs)  You should be screened for sexually transmitted infections (STIs) including gonorrhea and chlamydia if:  You are sexually active and are younger than 40 years of age.  You are older than 40 years of age and your health care provider tells you that you are at risk for this type of infection.  Your sexual activity has changed since you were last screened and you are at an increased risk for chlamydia or gonorrhea. Ask your health care provider if you are at risk.  If you do not have HIV, but are at risk, it may be recommended that you take a prescription medicine daily to prevent HIV infection. This is called pre-exposure  prophylaxis (PrEP). You are considered at risk if:  You are sexually active and do not regularly use condoms or know the HIV status of your partner(s).  You take drugs by injection.  You are sexually active with a partner who has HIV. Talk with your health care provider about whether you are at high risk of being infected with HIV. If you choose to begin PrEP, you should first be tested for HIV. You should then be tested every  3 months for as long as you are taking PrEP. Pregnancy  If you are premenopausal and you may become pregnant, ask your health care provider about preconception counseling.  If you may become pregnant, take 400 to 800 micrograms (mcg) of folic acid every day.  If you want to prevent pregnancy, talk to your health care provider about birth control (contraception). Osteoporosis and menopause  Osteoporosis is a disease in which the bones lose minerals and strength with aging. This can result in serious bone fractures. Your risk for osteoporosis can be identified using a bone density scan.  If you are 48 years of age or older, or if you are at risk for osteoporosis and fractures, ask your health care provider if you should be screened.  Ask your health care provider whether you should take a calcium or vitamin D supplement to lower your risk for osteoporosis.  Menopause may have certain physical symptoms and risks.  Hormone replacement therapy may reduce some of these symptoms and risks. Talk to your health care provider about whether hormone replacement therapy is right for you. Follow these instructions at home:  Schedule regular health, dental, and eye exams.  Stay current with your immunizations.  Do not use any tobacco products including cigarettes, chewing tobacco, or electronic cigarettes.  If you are pregnant, do not drink alcohol.  If you are breastfeeding, limit how much and how often you drink alcohol.  Limit alcohol intake to no more than 1 drink  per day for nonpregnant women. One drink equals 12 ounces of beer, 5 ounces of wine, or 1 ounces of hard liquor.  Do not use street drugs.  Do not share needles.  Ask your health care provider for help if you need support or information about quitting drugs.  Tell your health care provider if you often feel depressed.  Tell your health care provider if you have ever been abused or do not feel safe at home. This information is not intended to replace advice given to you by your health care provider. Make sure you discuss any questions you have with your health care provider. Document Released: 04/07/2011 Document Revised: 02/28/2016 Document Reviewed: 06/26/2015  2017 [employer]

## 2016-08-27 ENCOUNTER — Other Ambulatory Visit: Payer: Self-pay | Admitting: Internal Medicine

## 2016-09-02 ENCOUNTER — Telehealth: Payer: Self-pay | Admitting: Internal Medicine

## 2016-09-02 ENCOUNTER — Telehealth: Payer: Self-pay

## 2016-09-02 NOTE — Telephone Encounter (Signed)
Patient requesting call back on PA for [place]

## 2016-09-02 NOTE — Telephone Encounter (Signed)
PA initiated via CoverMyMeds key [id number]

## 2016-09-03 NOTE — Telephone Encounter (Signed)
See PA phone note dated 09/02/2016

## 2016-09-03 NOTE — Telephone Encounter (Signed)
PA APPROVED 09/02/2016 - 10/05/2038, pt advised via personal VM

## 2016-09-08 ENCOUNTER — Telehealth: Payer: Self-pay | Admitting: Internal Medicine

## 2016-09-09 ENCOUNTER — Ambulatory Visit (INDEPENDENT_AMBULATORY_CARE_PROVIDER_SITE_OTHER): Payer: BLUE CROSS/BLUE SHIELD | Admitting: Internal Medicine

## 2016-09-09 ENCOUNTER — Encounter: Payer: Self-pay | Admitting: Internal Medicine

## 2016-09-09 VITALS — BP 127/81 | HR 75 | Wt 172.0 lb

## 2016-09-09 DIAGNOSIS — E2749 Other adrenocortical insufficiency: Secondary | ICD-10-CM

## 2016-09-09 NOTE — Progress Notes (Signed)
Patient ID: Kathleen Sullivan, female   DOB: September 19, 1976, 40 y.o.   MRN: [id number]  Kathleen Sullivan is a 40 y.o. 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 Last visit 7 months ago.  Patient is currently on hydrocortisone 10 mg in a.m. and 5 mg in p.m.  At last visit, I advised her to return for a cosyntropin stimulation test to see if her hypothalamus-pituitary-adrenal axis recovered. She did not return for this.  She had surgery for her L knee >> it went very well. She developed a fever 1 week after surgery >> she did increase her hydrocortisone then, but she continues to have a sub-fever. She is wondering if her hydrocortisone dose needs to be changed.  Reviewed history: Pt described that she started to have a low body temperature in ~2013. She was feeling that she had a fever when her body temp was ~37F. She had cold sweats. During the investigation for low body temperature, she was found to have a low cortisol level:  Component     Latest Ref Rng 02/21/2014 03/03/2014  Cortisol - AM     4.3 - 22.4 ug/dL 1.3 (L) 3.2 (L)  C206 ACTH     6 - 50 pg/mL  8  She had one cortisone inj in the knee - 8 years before her central adrenal insufficiency diagnosis. She was off the steroid nasal spray long before the test.  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  No h/o hypothyroidism:  Lab Results  Component Value Date   TSH 1.51 02/21/2014   TSH 1.15 03/16/2009   FREET4 0.84 02/21/2014   The rest of the pituitary w/u was negative: Component     Latest Ref Rng 03/22/2014  Estradiol, Free      2.76  Estradiol      138  Results received      03/29/14  FSH      4.7  LH      5.35  Prolactin      15.7  Somatomedin (IGF-I)     66 - 336 ng/mL 121  Growth Hormone     0.00 - 8.00 ng/mL <0.10   A pituitary MRI (04/18/2014) showed no  pituitary or brain pathology   Review of symptoms: Constitutional: + see HPI Eyes: no blurry vision, no xerophthalmia ENT: No sore throat, no nodules palpated in throat, dysphagia/no odynophagia Cardiovascular: no CP/+ SOB/no palpitations/+ leg swelling (salt-dependent) Respiratory: + cough/+ SOB/ wheezing  Gastrointestinal: no N/V/D/C/heartburn Musculoskeletal: + both: muscle/joint aches Skin:no rash Neurological: no tremors/numbness/tingling/dizziness  I reviewed pt's medications, allergies, PMH, social hx, family hx, and changes were documented in the history of present illness. Otherwise, unchanged from my initial visit note.  Physical exam: BP 127/81   Pulse 75   Wt 172 lb (78 kg)   LMP 08/04/2016   BMI 34.74 kg/m  Body mass index is 34.74 kg/m.  Wt Readings from Last 3 Encounters:  09/09/16 172 lb (78 kg)  08/25/16 168 lb 6.4 oz (76.4 kg)  02/14/16 172 lb (78 kg)   Constitutional: obese, in NAD Eyes: PERRLA, EOMI, no exophthalmos ENT: moist mucous membranes, no thyromegaly, no cervical lymphadenopathy Cardiovascular: RRR, No MRG Respiratory: CTA B Gastrointestinal: abdomen soft, NT, ND, BS+ Musculoskeletal: no deformities, strength intact in all 4 Skin: moist, warm, no rashes Neurological: no tremor with outstretched  hands, DTR normal in all 4  Assessment: 1. Central adrenal insufficiency - likely 2/2 previous steroid inj  Plan: Pt has a history of iatrogenic adrenal insufficiency, currently on hydrocortisone replacement: 10 mg in a.m. and 5 mg in p.m. She feels well on this dose. Since last visit, she had surgery, during which she was covered with high dose hydrocortisone 50 mg IV every 6 hours. She developed a fever at home, for which increase her hydrocortisone dose by 10 mg for 2 days. She still has a sub-fever and she was wondering whether her hydrocortisone dose needs to be changed. I advised her that most likely it does not. There are no signs of infection,  and her knees healing well. Also, her lungs sound clear and she does not feel that she is more short of breath than normal. Also, she mentions that her temperature decreases every morning after medication. We discussed about monitoring her temperature for another week and then let me know about the results. We can possibly increase the hydrocortisone dose for a few days if her fever remains elevated, although, I would be reticent to do this only to see if her temperature decreases. - We again discussed about the fact that she has central rather than primary adrenal insufficiency, and at last visit we decided to check whether her pituitary-adrenal axis recovered. However, she did not return for the cosyntropin stimulation test. We will schedule this, but I advised her to come back approximately a week after she is feeling at baseline. - We again discussed about sick day rules: - the absolute need to take this medication every day and not skip doses. - to double the dose if has a fever, for the duration of the fever. - If cannot take anything by mouth (vomiting) or has severe diarrhea, to make sure that she injects the hydrocortisone in the muscle instead  - I will see her back in 6 months. We may need to repeat the cosyntropin stimulation test then.  I will addend the results of the stimulation when they become available.  Philemon Kingdom, MD PhD [facility]

## 2016-09-09 NOTE — Patient Instructions (Addendum)
Please come back for labs after the fever resolved.  Please come fasting. Skip Hydrocortisone the afternoon before and the day of the test, but bring your hydrocortisone with you to take right after the test. Plan to be here for an hour for the test.  - You absolutely need to take this medication every day and not skip doses. - Please double the dose if you have a fever, for the duration of the fever. - If you cannot take anything by mouth (vomiting) or you have severe diarrhea so that you eliminate the hydrocortisone pills in your stool, please make sure that you get the hydrocortisone in the vein instead - go to the nearest emergency department/urgent care or you may go to your PCPs office  - Please try to get a MedAlert bracelet or pendant indicating: "Adrenal insufficiency".  Please return in 6 months.

## 2016-12-11 ENCOUNTER — Other Ambulatory Visit: Payer: Self-pay | Admitting: Internal Medicine

## 2016-12-12 NOTE — Telephone Encounter (Signed)
please advise in Dr.Crawfords absence

## 2016-12-18 ENCOUNTER — Other Ambulatory Visit: Payer: Self-pay | Admitting: Internal Medicine

## 2017-02-08 ENCOUNTER — Other Ambulatory Visit: Payer: Self-pay | Admitting: Internal Medicine

## 2017-02-12 ENCOUNTER — Encounter: Payer: Self-pay | Admitting: Internal Medicine

## 2017-02-12 ENCOUNTER — Ambulatory Visit (INDEPENDENT_AMBULATORY_CARE_PROVIDER_SITE_OTHER): Payer: BLUE CROSS/BLUE SHIELD | Admitting: Internal Medicine

## 2017-02-12 VITALS — BP 124/82 | HR 71 | Wt 181.0 lb

## 2017-02-12 DIAGNOSIS — R6889 Other general symptoms and signs: Secondary | ICD-10-CM

## 2017-02-12 DIAGNOSIS — E2749 Other adrenocortical insufficiency: Secondary | ICD-10-CM

## 2017-02-12 DIAGNOSIS — E559 Vitamin D deficiency, unspecified: Secondary | ICD-10-CM

## 2017-02-12 DIAGNOSIS — E538 Deficiency of other specified B group vitamins: Secondary | ICD-10-CM | POA: Diagnosis not present

## 2017-02-12 LAB — VITAMIN D 25 HYDROXY (VIT D DEFICIENCY, FRACTURES): VITD: 12.94 ng/mL — AB (ref 30.00–100.00)

## 2017-02-12 LAB — CBC WITH DIFFERENTIAL/PLATELET
BASOS ABS: 0 10*3/uL (ref 0.0–0.1)
Basophils Relative: 0.3 % (ref 0.0–3.0)
EOS ABS: 0.1 10*3/uL (ref 0.0–0.7)
Eosinophils Relative: 0.7 % (ref 0.0–5.0)
HEMATOCRIT: 42.5 % (ref 36.0–46.0)
HEMOGLOBIN: 14.6 g/dL (ref 12.0–15.0)
LYMPHS PCT: 29 % (ref 12.0–46.0)
Lymphs Abs: 2.5 10*3/uL (ref 0.7–4.0)
MCHC: 34.3 g/dL (ref 30.0–36.0)
MCV: 87.7 fl (ref 78.0–100.0)
MONO ABS: 0.7 10*3/uL (ref 0.1–1.0)
Monocytes Relative: 7.8 % (ref 3.0–12.0)
Neutro Abs: 5.4 10*3/uL (ref 1.4–7.7)
Neutrophils Relative %: 62.2 % (ref 43.0–77.0)
Platelets: 269 10*3/uL (ref 150.0–400.0)
RBC: 4.84 Mil/uL (ref 3.87–5.11)
RDW: 12.8 % (ref 11.5–15.5)
WBC: 8.7 10*3/uL (ref 4.0–10.5)

## 2017-02-12 LAB — T3, FREE: T3 FREE: 3.8 pg/mL (ref 2.3–4.2)

## 2017-02-12 LAB — VITAMIN B12: VITAMIN B 12: 232 pg/mL (ref 211–911)

## 2017-02-12 LAB — HEMOGLOBIN A1C: Hgb A1c MFr Bld: 5.4 % (ref 4.6–6.5)

## 2017-02-12 LAB — TSH: TSH: 3.31 u[IU]/mL (ref 0.35–4.50)

## 2017-02-12 LAB — T4, FREE: FREE T4: 0.8 ng/dL (ref 0.60–1.60)

## 2017-02-12 NOTE — Progress Notes (Addendum)
Patient ID: Kathleen Sullivan, female   DOB: 08/18/1976, 41 y.o.   MRN: [id number]  Kathleen Sullivan is a 41 y.o. 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 Last visit 6 months ago.  Patient is currently on hydrocortisone 10 mg in a.m. and 5 mg in p.m. for steroid replacement.  At last visit, I advised her to return for a cosyntropin stimulation test to see if her hypothalamus-pituitary-adrenal axis recovered. She did not return for this.  However, at this visit, she tells me she is worried about her temperature, which is again low in the morning and increasing to 99.5-100 later (+ hot flashes) in the day. She also feels more fatigued, has eczema, and complains of brain fog.  Reviewed history: Pt described that she started to have a low body temperature in ~2013. She was feeling that she had a fever when her body temp was ~60F. She had cold sweats. During the investigation for low body temperature, she was found to have a low cortisol level:  Component     Latest Ref Rng 02/21/2014 03/03/2014  Cortisol - AM     4.3 - 22.4 ug/dL 1.3 (L) 3.2 (L)  C206 ACTH     6 - 50 pg/mL  8  She had one cortisone inj in the knee - 8 years before her central adrenal insufficiency diagnosis. She was off the steroid nasal spray long before the test.  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  No h/o hypothyroidism:  Lab Results  Component Value Date   TSH 1.51 02/21/2014   TSH 1.15 03/16/2009   FREET4 0.84 02/21/2014   The rest of the pituitary w/u was negative: Component     Latest Ref Rng 03/22/2014  Estradiol, Free      2.76  Estradiol      138  Results received      03/29/14  FSH      4.7  LH      5.35  Prolactin      15.7  Somatomedin (IGF-I)     66 - 336 ng/mL 121  Growth Hormone     0.00 - 8.00 ng/mL <0.10   A pituitary MRI  (04/18/2014) showed no pituitary or brain pathology   Review of symptoms: Constitutional: + weight gain, + fatigue, + both: subjective hyperthermia and hypothermia Eyes: + blurry vision, no xerophthalmia ENT: no sore throat, no nodules palpated in throat, no dysphagia, no odynophagia, no hoarseness Cardiovascular: no CP/+ SOB/no palpitations/no leg swelling Respiratory: + All: cough/no SOB/no wheezing Gastrointestinal: no N/no V/no D/no C/+ acid reflux Musculoskeletal: + Both muscle aches/ joint aches Skin: no rashes, no hair loss Neurological: no tremors/no numbness/no tingling/no dizziness, + brain fog, + headache  I reviewed pt's medications, allergies, PMH, social hx, family hx, and changes were documented in the history of present illness. Otherwise, unchanged from my initial visit note.  Physical exam: BP 124/82 (BP Location: Left Arm, Patient Position: Sitting)   Pulse 71   Wt 181 lb (82.1 kg)   LMP 01/26/2017   SpO2 97%   BMI 36.56 kg/m  Body mass index is 36.56 kg/m.  Wt Readings from Last 3 Encounters:  02/12/17 181 lb (82.1 kg)  09/09/16 172 lb (78 kg)  08/25/16 168 lb 6.4 oz (76.4 kg)   Constitutional: obese, in NAD Eyes: PERRLA, EOMI,  no exophthalmos ENT: moist mucous membranes, no thyromegaly, no cervical lymphadenopathy Cardiovascular: RRR, No MRG Respiratory: CTA B Gastrointestinal: abdomen soft, NT, ND, BS+ Musculoskeletal: no deformities, strength intact in all 4 Skin: moist, warm, no rashes Neurological: no tremor with outstretched hands, DTR normal in all 4  Assessment: 1. Central adrenal insufficiency - likely 2/2 previous steroid inj  2. Cold and heat intolerance  Plan: Pt has a history of iatrogenic adrenal insufficiency, currently on [facility] replacement: 10 mg in am and 5 mg in pm. She did not have to increase the dose or get iv steroids since last visit. No concerns about the dose, but I do not feel it is a good time to check a stim test and  possibly to take her off her [facility] as she has other complaints today - see below. - We again discussed about sick day rules: - the absolute need to take this medication every day and not skip doses. - to double the dose if has a fever, for the duration of the fever. - If cannot take anything by mouth (vomiting) or has severe diarrhea, to make sure that she injects the hydrocortisone in the muscle instead   2. Cold and heat intolerance - she c/o being cold in am and then feels hot (even hot flushes) later in the day. She also has fatigue, eczema and mental fog.  - will check the following labs today: Orders Placed This Encounter  Procedures  . CBC with Differential  . Hemoglobin A1c  . TSH  . T4, free  . T3, free  . Testosterone, Free, Total, SHBG  . Vitamin D, 25-hydroxy  . Vitamin B12   Component     Latest Ref Rng & Units 02/12/2017  WBC     4.0 - 10.5 K/uL 8.7  RBC     3.87 - 5.11 Mil/uL 4.84  Hemoglobin     12.0 - 15.0 g/dL 14.6  HCT     36.0 - 46.0 % 42.5  MCV     78.0 - 100.0 fl 87.7  MCHC     30.0 - 36.0 g/dL 34.3  RDW     11.5 - 15.5 % 12.8  Platelets     150.0 - 400.0 K/uL 269.0  Neutrophils     43.0 - 77.0 % 62.2  Lymphocytes     12.0 - 46.0 % 29.0  Monocytes Relative     3.0 - 12.0 % 7.8  Eosinophil     0.0 - 5.0 % 0.7  Basophil     0.0 - 3.0 % 0.3  NEUT#     1.4 - 7.7 K/uL 5.4  Lymphocyte #     0.7 - 4.0 K/uL 2.5  Monocyte #     0.1 - 1.0 K/uL 0.7  Eosinophils Absolute     0.0 - 0.7 K/uL 0.1  Basophils Absolute     0.0 - 0.1 K/uL 0.0  Testosterone     8 - 48 ng/dL 15  Sex Horm Binding Glob, Serum     24.6 - 122.0 nmol/L 36.1  Testosterone Free     0.0 - 4.2 pg/mL 1.0  Triiodothyronine,Free,Serum     2.3 - 4.2 pg/mL 3.8  TSH     0.35 - 4.50 uIU/mL 3.31  T4,Free(Direct)     0.60 - 1.60 ng/dL 0.80  Hemoglobin A1C     4.6 - 6.5 % 5.4  VITD     30.00 - 100.00 ng/mL 12.94 (L)  Vitamin B12  211 - 911 pg/mL 232   Labs are normal, except  vitamin D is very low and vitamin B12 is very close to the lower limit of normal. I would advised her to start 5000 units of vitamin D daily and 5000 g of vitamin B12 daily. We will need to check the levels of the 2 vitamins in 3 months.   Philemon Kingdom, MD PhD [facility]

## 2017-02-12 NOTE — Patient Instructions (Signed)
Please stop at the lab.  Please come back for a follow-up appointment in 6 months.

## 2017-02-14 LAB — TESTOSTERONE, FREE, TOTAL, SHBG
SEX HORMONE BINDING: 36.1 nmol/L (ref 24.6–122.0)
TESTOSTERONE: 15 ng/dL (ref 8–48)
Testosterone, Free: 1 pg/mL (ref 0.0–4.2)

## 2017-02-16 ENCOUNTER — Other Ambulatory Visit: Payer: Self-pay | Admitting: Internal Medicine

## 2017-02-17 ENCOUNTER — Telehealth: Payer: Self-pay

## 2017-02-17 NOTE — Telephone Encounter (Signed)
Patient called and had questions on which brand for the vitamin d3, and b12. I advised she could buy those at any [facility] or [facility] over the counter, patient stated that was fine, she just didn't know if their was a certain brand. I stated no. Patient understood.

## 2017-02-17 NOTE — Addendum Note (Signed)
Addended by: Philemon Kingdom on: 02/17/2017 07:41 AM   Modules accepted: Orders

## 2017-02-25 ENCOUNTER — Encounter: Payer: Self-pay | Admitting: Internal Medicine

## 2017-02-25 ENCOUNTER — Ambulatory Visit (INDEPENDENT_AMBULATORY_CARE_PROVIDER_SITE_OTHER): Payer: BLUE CROSS/BLUE SHIELD | Admitting: Internal Medicine

## 2017-02-25 DIAGNOSIS — E2749 Other adrenocortical insufficiency: Secondary | ICD-10-CM | POA: Diagnosis not present

## 2017-02-25 DIAGNOSIS — G479 Sleep disorder, unspecified: Secondary | ICD-10-CM

## 2017-02-25 MED ORDER — SUVOREXANT 15 MG PO TABS: 15.0000 mg | ORAL_TABLET | Freq: Every day | ORAL | 5 refills | Status: DC

## 2017-02-25 MED ORDER — ALBUTEROL SULFATE 108 (90 BASE) MCG/ACT IN AEPB: 1.0000 | INHALATION_SPRAY | Freq: Four times a day (QID) | RESPIRATORY_TRACT | 2 refills | Status: DC | PRN

## 2017-02-25 NOTE — Progress Notes (Signed)
   Subjective:    Patient ID: Kathleen Sullivan, female    DOB: Nov 17, 1975, 41 y.o.   MRN: [id number]  HPI The patient is a 41 YO female coming in for follow up of her insomnia. She is still taking belsomra which is the only medication which has worked well for her in the past. She has tried many other agents. She denies side effects. Sleeping through the night most of the time. She is still having some hot flashes (fevers per patient) in the evening and is working with endo and gyn to evaluate source. She denies any new symptoms of infection (we have evaluated her in the past for same).   Review of Systems  Constitutional: Positive for fatigue. Negative for activity change, appetite change, fever and unexpected weight change.  Respiratory: Negative.   Cardiovascular: Negative.   Gastrointestinal: Negative.   Endocrine: Positive for heat intolerance. Negative for cold intolerance.  Musculoskeletal: Negative.   Neurological: Negative.   Psychiatric/Behavioral: Positive for sleep disturbance.      Objective:   Physical Exam  Constitutional: She is oriented to person, place, and time. She appears well-developed and well-nourished.  HENT:  Head: Normocephalic and atraumatic.  Eyes: EOM are normal.  Neck: Normal range of motion.  Cardiovascular: Normal rate and regular rhythm.   Pulmonary/Chest: Effort normal and breath sounds normal.  Abdominal: Soft. She exhibits no distension. There is no tenderness. There is no rebound.  Neurological: She is alert and oriented to person, place, and time.  Skin: Skin is warm and dry.   Vitals:   02/25/17 0827  BP: 118/76  Pulse: 79  Resp: 12  Temp: 98.2 F (36.8 C)  TempSrc: Oral  SpO2: 98%  Weight: 179 lb (81.2 kg)  Height: 4' 11"  (1.499 m)      Assessment & Plan:

## 2017-02-25 NOTE — Patient Instructions (Addendum)
We have sent in the proair respiclick and the belsomra.

## 2017-02-27 NOTE — Assessment & Plan Note (Addendum)
Still taking belsomra with good results and will refill. She has tried and failed many other agents and no side effects.

## 2017-02-27 NOTE — Assessment & Plan Note (Signed)
No indication of acute infection and she is still having hot flashes in the evening which may be hormonally related.

## 2017-03-16 ENCOUNTER — Emergency Department (HOSPITAL_COMMUNITY): Payer: BLUE CROSS/BLUE SHIELD

## 2017-03-16 ENCOUNTER — Emergency Department (HOSPITAL_COMMUNITY)
Admission: EM | Admit: 2017-03-16 | Discharge: 2017-03-17 | Disposition: A | Discharge status: Home / Self Care | Payer: BLUE CROSS/BLUE SHIELD | Attending: Emergency Medicine | Admitting: Emergency Medicine

## 2017-03-16 ENCOUNTER — Telehealth: Payer: Self-pay | Admitting: Cardiology

## 2017-03-16 ENCOUNTER — Encounter (HOSPITAL_COMMUNITY): Payer: Self-pay | Admitting: Emergency Medicine

## 2017-03-16 DIAGNOSIS — R079 Chest pain, unspecified: Secondary | ICD-10-CM | POA: Diagnosis present

## 2017-03-16 DIAGNOSIS — R0789 Other chest pain: Secondary | ICD-10-CM | POA: Diagnosis not present

## 2017-03-16 DIAGNOSIS — Z87891 Personal history of nicotine dependence: Secondary | ICD-10-CM | POA: Insufficient documentation

## 2017-03-16 DIAGNOSIS — J45909 Unspecified asthma, uncomplicated: Secondary | ICD-10-CM | POA: Diagnosis not present

## 2017-03-16 DIAGNOSIS — Z79899 Other long term (current) drug therapy: Secondary | ICD-10-CM | POA: Insufficient documentation

## 2017-03-16 LAB — BASIC METABOLIC PANEL
Anion gap: 8 (ref 5–15)
BUN: 7 mg/dL (ref 6–20)
CHLORIDE: 106 mmol/L (ref 101–111)
CO2: 23 mmol/L (ref 22–32)
CREATININE: 0.82 mg/dL (ref 0.44–1.00)
Calcium: 9.6 mg/dL (ref 8.9–10.3)
GFR calc non Af Amer: >60 mL/min (ref >60)
Glucose, Bld: 89 mg/dL (ref 65–99)
POTASSIUM: 3.8 mmol/L (ref 3.5–5.1)
Sodium: 137 mmol/L (ref 135–145)

## 2017-03-16 LAB — CBC
HEMATOCRIT: 44.8 % (ref 36.0–46.0)
Hemoglobin: 15.9 g/dL — ABNORMAL HIGH (ref 12.0–15.0)
MCH: 30.6 pg (ref 26.0–34.0)
MCHC: 35.5 g/dL (ref 30.0–36.0)
MCV: 86.2 fL (ref 78.0–100.0)
Platelets: 256 10*3/uL (ref 150–400)
RBC: 5.2 MIL/uL — AB (ref 3.87–5.11)
RDW: 12.7 % (ref 11.5–15.5)
WBC: 9 10*3/uL (ref 4.0–10.5)

## 2017-03-16 LAB — I-STAT TROPONIN, ED: TROPONIN I, POC: 0 ng/mL (ref 0.00–0.08)

## 2017-03-16 NOTE — ED Notes (Signed)
Pt states she also has seen the dentist multiple times this year, but went for her yearly cleaning and forgot to take her pre-antibiotics.

## 2017-03-16 NOTE — ED Triage Notes (Signed)
Pt presents to ED for assessment of left sided chest squeezing x 2 weeks.  Pt has a significant hx, including mitral and tricuspid vale damage, adrenal insufficiency.  Pt states she has had intermittent SOB, even at rest.  Patient anxious at triage.  C/o back pain.  Denies n/v.

## 2017-03-16 NOTE — Telephone Encounter (Signed)
Spoke with Pt who c/o  CP that has been occurring for the past 3 weeks. She says the CP comes and goes throughout the day. At the time of the call, Pt was experiencing the CP and stated it felt like someone was "squeezing her heart" and she feels like her chest is constricted. She says its a lot of pressure that she feels in the center of her chest. She denies left arm pain, blurry vision. She does c/o SOB but may be allergy related. She doesn't know if its related to the chest pressure or allergies. I advised Pt to go to [facility] ER to be evaluated since she is currently experiencing Chest pressure. I told Pt to take it easy, relax and to drink fluids. She stated her husband will take her to the ER when he gets home. Pt has an appt with Melina Copa, PA on 04/02/17 for chest pressure. Pt thanked me for my call.

## 2017-03-16 NOTE — Telephone Encounter (Signed)
New Message   Pt states it feels like someone is squeezing her heart.   Pt c/o of Chest Pain: STAT if CP now or developed within 24 hours  1. Are you having CP right now?  yes  2. Are you experiencing any other symptoms (ex. SOB, nausea, vomiting, sweating)?  Sob maybe allergies ,   3. How long have you been experiencing CP? 3 weeks   4. Is your CP continuous or coming and going? Comes and goes   5. Have you taken Nitroglycerin? no ?

## 2017-03-17 ENCOUNTER — Telehealth: Payer: Self-pay | Admitting: Internal Medicine

## 2017-03-17 ENCOUNTER — Other Ambulatory Visit: Payer: Self-pay

## 2017-03-17 LAB — I-STAT TROPONIN, ED: Troponin i, poc: 0 ng/mL (ref 0.00–0.08)

## 2017-03-17 MED ORDER — PERMETHRIN 1 % EX LOTN: TOPICAL_LOTION | CUTANEOUS | 0 refills | Status: DC

## 2017-03-17 NOTE — Telephone Encounter (Signed)
Pt called in said that she had to go to the er and was sitting in the waiting room and was there for 6 hours yesterday.  She said that she has gotten home and found lice in her head.  She wants to know if someone can call in script for this?

## 2017-03-17 NOTE — ED Provider Notes (Signed)
[facility] DEPT Provider Note   CSN: [id number] Arrival date & time: 03/16/17  1912  By signing my name below, I, Evelene Croon, attest that this documentation has been prepared under the direction and in the presence of Sherwood Gambler, MD . Electronically Signed: Evelene Croon, Scribe. 03/17/2017. 12:23 AM.   History   Chief Complaint Chief Complaint  Patient presents with  . Chest Pain    The history is provided by the patient. No language interpreter was used.   HPI Comments:  Kathleen Sullivan is a 41 y.o. female with a history of mild mitral and tricuspid regurgitation and adrenal failure, who presents to the Emergency Department complaining of central squeezing CP x 3 weeks. She states the episodes are intermittent but she has had the episodes daily. They are strong for a couple seconds, last a couple minutes. No alleviating factors noted. Pt was advised to come into ED for further evaluation by cardiologist who she called due to her PMHx. She denies palpitations and SOB. She reports taking a Xanax ~2.5 hours ago.   Katarina Nelson-Cardiologist ( has appointment in 2 weeks)   Past Medical History:  Diagnosis Date  . Adrenal failure ([place])    takes solucortef daily  . Adrenal failure ([place])   . Allergy, unspecified not elsewhere classified   . Anxiety   . Condyloma acuminatum   . Depression   . Esophageal reflux    takes OTC when needed  . Hallux rigidus   . Headache(784.0)   . Mild mitral regurgitation   . Mild tricuspid regurgitation   . Other malignant neoplasm without specification of site    skin cancer  . Other specified personal history presenting hazards to health(V15.89)    atypical hx of chest pain  . Panic disorder without agoraphobia   . Personal history of unspecified circulatory disease   . Unspecified asthma(493.90)    allergy induced asthma  . Unspecified chronic bronchitis [facility])     Patient Active Problem List   Diagnosis Date Noted  .  Secondary adrenal insufficiency ([place]) 03/22/2014  . Acne 12/27/2013  . Routine health maintenance 12/26/2012  . Sleep disorder 11/21/2011  . Hallux rigidus 04/23/2009  . VENEREAL WART 10/15/2007  . Asthma 10/15/2007  . GASTROESOPHAGEAL REFLUX DISEASE 10/15/2007  . ALLERGY 10/15/2007  . Bipolar 1 disorder, manic, moderate ([place]) 10/01/2007    Past Surgical History:  Procedure Laterality Date  . CESAREAN SECTION    . ESSURE TUBAL LIGATION    . FINGER SURGERY    . NASAL SEPTOPLASTY W/ TURBINOPLASTY N/A 06/07/2015   Procedure: NASAL SEPTOPLASTY WITH TURBINATE REDUCTION ;  Surgeon: Rozetta Nunnery, MD;  Location: [facility];  Service: ENT;  Laterality: N/A;    OB History    No data available       Home Medications    Prior to Admission medications   Medication Sig Start Date End Date Taking? Authorizing Provider  Albuterol Sulfate (PROAIR RESPICLICK) 539 (90 Base) MCG/ACT AEPB Inhale 1 puff into the lungs 4 (four) times daily as needed (sob). 02/25/17   Hoyt Koch, MD  ALPRAZolam Duanne Moron) 0.5 MG tablet TAKE 1-2 TABS BY MOUTH DAILY AS NEEDED FOR PANIC ATTACK 02/16/17   Hoyt Koch, MD  cholecalciferol (VITAMIN D) 1000 units tablet Take 6,000 Units by mouth daily.    [provider]  fexofenadine (ALLEGRA) 180 MG tablet Take 180 mg by mouth daily.    [provider]  gabapentin (NEURONTIN) 300 MG  capsule Take 300 mg by mouth 2 (two) times daily.    [provider]  hydrocortisone (CORTEF) 5 MG tablet TAKE 2 TABLETS BY MOUTH IN THE MORNING TAKE 1 TABLET BY MOUTH IN THE EVENING NO LATER THAN 6PM 02/09/17   Philemon Kingdom, MD  hydrocortisone 2.5 % cream APPLY ON THE SKIN TWICE A DAY AS NEEDED 01/06/17   [provider]  hydrocortisone sodium succinate (SOLU-CORTEF) 100 MG SOLR injection Inject in the muscle 100 mg as needed if you cannot take the hydrocortisone by mouth 03/22/14   Philemon Kingdom, MD  montelukast  (SINGULAIR) 10 MG tablet TAKE ONE TABLET NIGHTLY AT BEDTIME 08/25/16   Hoyt Koch, MD  Olopatadine HCl (PATADAY) 0.2 % SOLN Apply 2 drops to eye every 4 (four) hours as needed. 08/25/16   Hoyt Koch, MD  Suvorexant (BELSOMRA) 15 MG TABS Take 15 mg by mouth at bedtime. 02/25/17   Hoyt Koch, MD  SYRINGE-NEEDLE, DISP, 3 ML (B-D SYRINGE/NEEDLE 3CC/25GX5/8) 25G X 5/8" 3 ML MISC Use as needed 03/22/14   Philemon Kingdom, MD  Triamcinolone Acetonide (TRIAMCINOLONE 0.1 % CREAM : EUCERIN) CREA Apply 1 application topically.    [provider]  valACYclovir (VALTREX) 1000 MG tablet Take 1 tablet (1,000 mg total) by mouth 2 (two) times daily. 08/25/16   Hoyt Koch, MD  vitamin B-12 (CYANOCOBALAMIN) 1000 MCG tablet Take 1,000 mcg by mouth daily.    [provider]  Water For Inject Bact Parabens (STERILE WATER, BACTERIOSTATIC,) SOLN injection Please dissolve the hydrocortisone solution and inject 100 mg in the muscle as needed 03/22/14   Philemon Kingdom, MD    Family History Family History  Problem Relation Age of Onset  . Breast cancer Mother   . Uterine cancer Mother   . Mental illness Mother   . Anxiety disorder Mother   . Diabetes Brother   . Breast cancer Other   . Coronary artery disease Other   . Stroke Other   . Diabetes Other   . Hypertension Other   . Colon cancer Neg Hx     Social History Social History  Substance Use Topics  . Smoking status: Former Research scientist (life sciences)  . Smokeless tobacco: Never Used     Comment: >15 yrs ago.  . Alcohol use Yes     Comment: occasional     Allergies   Diflucan [fluconazole]; Metoclopramide hcl; and Tuna [fish allergy]   Review of Systems Review of Systems  Respiratory: Negative for shortness of breath.   Cardiovascular: Positive for chest pain. Negative for palpitations.  All other systems reviewed and are negative.   Physical Exam Updated Vital Signs BP (!) 107/59   Pulse 67   Temp  98.7 F (37.1 C) (Oral)   Resp (!) 21   LMP 02/25/2017   SpO2 96%   Physical Exam  Constitutional: She is oriented to person, place, and time. She appears well-developed and well-nourished.  HENT:  Head: Normocephalic and atraumatic.  Right Ear: External ear normal.  Left Ear: External ear normal.  Nose: Nose normal.  Eyes: Right eye exhibits no discharge. Left eye exhibits no discharge.  Cardiovascular: Normal rate, regular rhythm and normal heart sounds.   Pulses:      Radial pulses are 2+ on the right side, and 2+ on the left side.  Pulmonary/Chest: Effort normal and breath sounds normal.  Abdominal: Soft. There is no tenderness.  Neurological: She is alert and oriented to person, place, and time.  Skin:  Skin is warm and dry.  Nursing note and vitals reviewed.    ED Treatments / Results  DIAGNOSTIC STUDIES:  Oxygen Saturation is 96% on RA, normal by my interpretation.    COORDINATION OF CARE:  12:27 AM Discussed treatment plan with pt at bedside and pt agreed to plan.  Labs (all labs ordered are listed, but only abnormal results are displayed) Labs Reviewed  CBC - Abnormal; Notable for the following:       Result Value   RBC 5.20 (*)    Hemoglobin 15.9 (*)    All other components within normal limits  BASIC METABOLIC PANEL  I-STAT TROPOININ, ED  I-STAT TROPOININ, ED    EKG  EKG Interpretation  Date/Time:  Monday March 16 2017 19:54:27 EDT Ventricular Rate:  63 PR Interval:  122 QRS Duration: 80 QT Interval:  392 QTC Calculation: 401 R Axis:   36 Text Interpretation:  Normal sinus rhythm with sinus arrhythmia Normal ECG no significant change since 2015 Confirmed by Sherwood Gambler [id number]) on 03/17/2017 12:08:37 AM       EKG Interpretation  Date/Time:  Tuesday March 17 2017 00:35:48 EDT Ventricular Rate:  54 PR Interval:  122 QRS Duration: 93 QT Interval:  408 QTC Calculation: 387 R Axis:   46 Text Interpretation:  Sinus rhythm Low voltage,  precordial leads Abnormal R-wave progression, early transition no significant change since yesterday. Confirmed by Sherwood Gambler [phone]) on 03/17/2017 12:39:54 AM       Radiology Dg Chest 2 View  Result Date: 03/16/2017 CLINICAL DATA:  Chest pain EXAM: CHEST  2 VIEW COMPARISON:  02/26/2014 FINDINGS: The heart size and mediastinal contours are within normal limits. Both lungs are clear. Mild calcific tendinitis right shoulder. IMPRESSION: No active cardiopulmonary disease. Electronically Signed   By: Donavan Foil M.D.   On: 03/16/2017 21:18    Procedures Procedures (including critical care time)  Medications Ordered in ED Medications - No data to display   Initial Impression / Assessment and Plan / ED Course  I have reviewed the triage vital signs and the nursing notes.  Pertinent labs & imaging results that were available during my care of the patient were reviewed by me and considered in my medical decision making (see chart for details).     Patient is well-appearing here. Has benign ECGs with no dynamic changes. 2 negative troponins. Her chest pain is quite atypical and has been present though intermittent for the last few weeks. She appears stable for further outpatient workup by her cardiologist. Very low suspicion for ACS, PE, dissection.  Final Clinical Impressions(s) / ED Diagnoses   Final diagnoses:  Atypical chest pain    New Prescriptions Discharge Medication List as of 03/17/2017  1:12 AM     I personally performed the services described in this documentation, which was scribed in my presence. The recorded information has been reviewed and is accurate.     Sherwood Gambler, MD 03/17/17 [phone]

## 2017-03-17 NOTE — Telephone Encounter (Signed)
Permetherin lotion sent to pharmacy.

## 2017-03-17 NOTE — Telephone Encounter (Signed)
Pt called back checking on this. She said that she has a young child and does not want it to spread to her. She would like to use the [facility] on [address]. Thank you!!

## 2017-03-18 NOTE — Telephone Encounter (Signed)
Called pt no answer LMOM w/Greg response.Marland KitchenJohny Sullivan

## 2017-03-23 ENCOUNTER — Telehealth: Payer: Self-pay | Admitting: Cardiology

## 2017-03-23 NOTE — Telephone Encounter (Signed)
New Message    Pt would like to speak to you about the results from her test from the emergency room, she is confused on the results.   Also having dental work next week and she has to be premedicated, but she doesn't want to have the dental work done if there is something wrong

## 2017-03-23 NOTE — Telephone Encounter (Addendum)
Pt went to ED 03/16/17, noted in [place] today  that EKG results from  that visit noted poor R wave progression and low voltage. Pt states no one in ED discussed these findings with her, she is concerned because she feels these are new findings. Pt states she does not have any new cardiac symptoms since ED visit 03/16/17. Pt states she has a dental appointment next week and would like to come in before then to be checked.  Pt advised I have rescheduled 04/02/17 appt already scheduled with Melina Copa, PA   to tomorrow 03/24/17  at 3 PM.

## 2017-03-24 ENCOUNTER — Encounter: Payer: Self-pay | Admitting: Physician Assistant

## 2017-03-24 ENCOUNTER — Ambulatory Visit (INDEPENDENT_AMBULATORY_CARE_PROVIDER_SITE_OTHER): Payer: BLUE CROSS/BLUE SHIELD | Admitting: Physician Assistant

## 2017-03-24 VITALS — BP 100/72 | HR 57 | Ht 59.0 in | Wt 179.8 lb

## 2017-03-24 DIAGNOSIS — E2740 Unspecified adrenocortical insufficiency: Secondary | ICD-10-CM

## 2017-03-24 DIAGNOSIS — I340 Nonrheumatic mitral (valve) insufficiency: Secondary | ICD-10-CM | POA: Diagnosis not present

## 2017-03-24 DIAGNOSIS — I493 Ventricular premature depolarization: Secondary | ICD-10-CM

## 2017-03-24 DIAGNOSIS — R0789 Other chest pain: Secondary | ICD-10-CM | POA: Diagnosis not present

## 2017-03-24 DIAGNOSIS — I071 Rheumatic tricuspid insufficiency: Secondary | ICD-10-CM | POA: Diagnosis not present

## 2017-03-24 NOTE — Patient Instructions (Addendum)
Medication Instructions:  Your physician recommends that you continue on your current medications as directed. Please refer to the Current Medication list given to you today.   Labwork: None ordered  Testing/Procedures: Your physician recommends that you get a Coronary Calcium Score done.  Your physician has requested that you have an echocardiogram. Echocardiography is a painless test that uses sound waves to create images of your heart. It provides your doctor with information about the size and shape of your heart and how well your heart's chambers and valves are working. This procedure takes approximately one hour. There are no restrictions for this procedure.   Follow-Up: Your physician wants you to follow-up in: 6 MONTHS WITH DR. Johann Capers will receive a reminder letter in the mail two months in advance. If you don't receive a letter, please call our office to schedule the follow-up appointment.    Any Other Special Instructions Will Be Listed Below (If Applicable). Echocardiogram An echocardiogram, or echocardiography, uses sound waves (ultrasound) to produce an image of your heart. The echocardiogram is simple, painless, obtained within a short period of time, and offers valuable information to your health care provider. The images from an echocardiogram can provide information such as:  Evidence of coronary artery disease (CAD).  Heart size.  Heart muscle function.  Heart valve function.  Aneurysm detection.  Evidence of a past heart attack.  Fluid buildup around the heart.  Heart muscle thickening.  Assess heart valve function.  Tell a health care provider about:  Any allergies you have.  All medicines you are taking, including vitamins, herbs, eye drops, creams, and over-the-counter medicines.  Any problems you or family members have had with anesthetic medicines.  Any blood disorders you have.  Any surgeries you have had.  Any medical conditions you  have.  Whether you are pregnant or may be pregnant. What happens before the procedure? No special preparation is needed. Eat and drink normally. What happens during the procedure?  In order to produce an image of your heart, gel will be applied to your chest and a wand-like tool (transducer) will be moved over your chest. The gel will help transmit the sound waves from the transducer. The sound waves will harmlessly bounce off your heart to allow the heart images to be captured in real-time motion. These images will then be recorded.  You may need an IV to receive a medicine that improves the quality of the pictures. What happens after the procedure? You may return to your normal schedule including diet, activities, and medicines, unless your health care provider tells you otherwise. This information is not intended to replace advice given to you by your health care provider. Make sure you discuss any questions you have with your health care provider. Document Released: 09/19/2000 Document Revised: 05/10/2016 Document Reviewed: 05/30/2013 Elsevier Interactive Patient Education  2017 [employer].     If you need a refill on your cardiac medications before your next appointment, please call your pharmacy.

## 2017-03-24 NOTE — Progress Notes (Addendum)
Cardiology Office Note    Date:  03/24/2017  ID:  BETHENE HANKINSON, DOB 28-Jan-1976, MRN [id number] PCP:  Hoyt Koch, MD  Cardiologist:  Meda Coffee   Chief Complaint: chest pain  History of Present Illness:  KRISTIAN HAZZARD is a 41 y.o. female with history of panic disorder, adrenal insufficiency, GERD, asthma, obesity, atypical chest pain who presents back for evaluation of chest pain. She followed by Dr. Stanford Breed for mitral and tricuspid insufficiency and palpitations. She had echocardiogram performed in 2008 that showed normal biventricular size and function and only mild mitral and tricuspid regurgitation. She was tested for palpitations that turned out to be frequent PVCs. She was not interested in being started on beta blockers. Last echo 09/2014 showed EF 60%, trivial MR, trivial TR. Prior CP felt due to reflux. No prior hx of stress test.  She presents back for evaluation of CP. This has been present for 4 weeks lasting seconds to minutes, not provoked by anything particular, not worse with exertion, inspiration, palpation or meals. It feels like a "squeeze." Not worse with position changes. The only thing that makes it worse possibly is getting upset. She has h/o panic disorder with specific fear of dying. No SOB, syncope. Palpitations are generally well controlled. She was seen in the ED for chest pain at which time labs 03/16/2017 showed neg troponin, Hgb 15.9, plt 256, normal BMET; thyroid function and A1C were normal in 02/2017. 12 lead showed NSR with lower voltage, nonspecific ST changes  nonacute appearing and not significantly changed from prior. Upon reading the finding regarding the low voltage, she became extremely concerned and went back in her prior surgery charts from a year ago to make sure there was no mention of prior coding or cardiac event. She remains concerned about having dental work several months ago without premedication. She is aware this is no longer  recommended in the type of valvular disease she had previously (was told she had MVP remotely). No recent travel, surgery, bedrest.   Past Medical History:  Diagnosis Date  . Adrenal failure ([place])    takes solucortef daily  . Adrenal insufficiency ([place])   . Allergy, unspecified not elsewhere classified   . Anxiety   . Atypical chest pain   . Condyloma acuminatum   . Depression   . Esophageal reflux    takes OTC when needed  . Hallux rigidus   . Headache(784.0)   . Mild mitral regurgitation   . Mild tricuspid regurgitation   . Other malignant neoplasm without specification of site    skin cancer  . Panic disorder without agoraphobia   . Personal history of unspecified circulatory disease   . PVC's (premature ventricular contractions)   . Unspecified asthma(493.90)    allergy induced asthma    Past Surgical History:  Procedure Laterality Date  . CESAREAN SECTION    . ESSURE TUBAL LIGATION    . FINGER SURGERY    . NASAL SEPTOPLASTY W/ TURBINOPLASTY N/A 06/07/2015   Procedure: NASAL SEPTOPLASTY WITH TURBINATE REDUCTION ;  Surgeon: Rozetta Nunnery, MD;  Location: [facility];  Service: ENT;  Laterality: N/A;    Current Medications: Current Outpatient Prescriptions  Medication Sig Dispense Refill  . Albuterol Sulfate (PROAIR RESPICLICK) 814 (90 Base) MCG/ACT AEPB Inhale 1 puff into the lungs 4 (four) times daily as needed (sob). 1 each 2  . ALPRAZolam (XANAX) 0.5 MG tablet TAKE 1-2 TABS BY MOUTH DAILY AS NEEDED FOR PANIC ATTACK 60  tablet 1  . cholecalciferol (VITAMIN D) 1000 units tablet Take 6,000 Units by mouth daily.    . fexofenadine (ALLEGRA) 180 MG tablet Take 180 mg by mouth daily.    Marland Kitchen gabapentin (NEURONTIN) 300 MG capsule Take 300 mg by mouth 2 (two) times daily.    . hydrocortisone (CORTEF) 5 MG tablet TAKE 2 TABLETS BY MOUTH IN THE MORNING TAKE 1 TABLET BY MOUTH IN THE EVENING NO LATER THAN 6PM 270 tablet 3  . hydrocortisone 2.5 % cream Apply 1  application topically 2 (two) times daily as needed (Use as directed).    . hydrocortisone sodium succinate (SOLU-CORTEF) 100 MG SOLR injection Inject in the muscle 100 mg as needed if you cannot take the hydrocortisone by mouth 5 each prn  . montelukast (SINGULAIR) 10 MG tablet Take 10 mg by mouth at bedtime.    . Olopatadine HCl 0.2 % SOLN Apply 2 drops to eye every 4 (four) hours as needed (Use as directed).    . Suvorexant (BELSOMRA) 15 MG TABS Take 15 mg by mouth at bedtime. 30 tablet 5  . SYRINGE-NEEDLE, DISP, 3 ML (B-D SYRINGE/NEEDLE 3CC/25GX5/8) 25G X 5/8" 3 ML MISC Use as needed 50 each 0  . Triamcinolone Acetonide (TRIAMCINOLONE 0.1 % CREAM : EUCERIN) CREA Apply 1 application topically as directed.     . valACYclovir (VALTREX) 1000 MG tablet Take 1 tablet (1,000 mg total) by mouth 2 (two) times daily. 60 tablet 3  . vitamin B-12 (CYANOCOBALAMIN) 1000 MCG tablet Take 1,000 mcg by mouth daily.    . Water For Inject Bact Parabens (STERILE WATER, BACTERIOSTATIC,) SOLN injection Please dissolve the hydrocortisone solution and inject 100 mg in the muscle as needed 90 mL prn   No current facility-administered medications for this visit.      Allergies:   Diflucan [fluconazole]; Metoclopramide hcl; and Blain Pais allergy]   Social History   Social History  . Marital status: Married    Spouse name: N/A  . Number of children: 1  . Years of education: 31   Occupational History  . pharmacy tech   . student     nursing   Social History Main Topics  . Smoking status: Former Research scientist (life sciences)  . Smokeless tobacco: Never Used     Comment: >15 yrs ago.  . Alcohol use Yes     Comment: occasional  . Drug use: No  . Sexual activity: Yes    Partners: Male     Comment: has had Essure   Other Topics Concern  . None   Social History Narrative   HSG, doing college studies - online (Feb '13). Married '01, 1 dtr - '05. 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 Breast cancer Mother   . Uterine cancer Mother   . Mental illness Mother   . Anxiety disorder Mother   . Diabetes Brother   . Breast cancer Other   . Coronary artery disease Other   . Stroke Other   . Diabetes Other   . Hypertension Other   . Colon cancer Neg Hx     ROS:   Please see the history of present illness.  All other systems are reviewed and otherwise negative.    PHYSICAL EXAM:   VS:  BP 100/72   Pulse (!) 57   Ht 4' 11"  (1.499 m)   Wt 179 lb 12.8 oz (81.6 kg)  LMP 02/25/2017   SpO2 98%   BMI 36.32 kg/m   BMI: Body mass index is 36.32 kg/m. GEN: Well nourished, well developed WF in no acute distress  HEENT: normocephalic, atraumatic Neck: no JVD, carotid bruits, or masses Cardiac: RRR; no murmurs, rubs, or gallops, no edema  Respiratory:  clear to auscultation bilaterally, normal work of breathing GI: soft, nontender, nondistended, + BS MS: no deformity or atrophy  Skin: warm and dry, no rash Neuro:  Alert and Oriented x 3, Strength and sensation are intact, follows commands Psych: euthymic mood, full affect  Wt Readings from Last 3 Encounters:  03/24/17 179 lb 12.8 oz (81.6 kg)  02/25/17 179 lb (81.2 kg)  02/12/17 181 lb (82.1 kg)      Studies/Labs Reviewed:   EKG:  EKG was ordered today and personally reviewed by me and demonstrates SB 57bpm, TWI III, scooped ST upsloping I, II, avL, V5-V6 similar to prior.  Recent Labs: 08/25/2016: ALT 12 02/12/2017: TSH 3.31 03/16/2017: BUN 7; Creatinine, Ser 0.82; Hemoglobin 15.9; Platelets 256; Potassium 3.8; Sodium 137   Lipid Panel    Component Value Date/Time   CHOL 217 (H) 08/25/2016 0955   TRIG 272.0 (H) 08/25/2016 0955   HDL 45.30 08/25/2016 0955   CHOLHDL 5 08/25/2016 0955   VLDL 54.4 (H) 08/25/2016 0955   LDLCALC 127 (H) 03/16/2009 1439   LDLDIRECT 149.0 08/25/2016 0955    Additional studies/ records that were reviewed today include: Summarized  above.    ASSESSMENT & PLAN:   1. Atypical chest pain - very atypical, question neuropathic pain. Patient is anxious about her symptoms. No significant coronary risk factors. Longterm toe issues prevents her ability to do an ETT. Not sure symptoms warrant a nuc at this time. I feel that coronary calcium scoring would be a good screening strategy. She also is very concerned about her prior valve issues and would feel reassurred by echocardiogram. Will order. We discussed that dental SBE is unlikely to be recommended in the setting of MVP or MR even if it were still present.  2. Adrenal insufficiency - followed by endocrine. 3. Trace MR/TR previously - f/u echo. 4. PVCs - generally well controlled per patient.  Disposition: F/u with Dr. Meda Coffee in 6 months, sooner if progressive sx or abnormal studies warranting f/u.   Medication Adjustments/Labs and Tests Ordered: Current medicines are reviewed at length with the patient today.  Concerns regarding medicines are outlined above. Medication changes, Labs and Tests ordered today are summarized above and listed in the Patient Instructions accessible in Encounters.   Signed, Charlie Pitter, PA-C  03/24/2017 3:36 PM    [facility] [address], [place], [place]  [address] Phone: [phone]; Fax: [phone]

## 2017-04-02 ENCOUNTER — Ambulatory Visit: Payer: BLUE CROSS/BLUE SHIELD | Admitting: Physician Assistant

## 2017-04-06 ENCOUNTER — Other Ambulatory Visit: Payer: Self-pay

## 2017-04-06 ENCOUNTER — Ambulatory Visit (HOSPITAL_COMMUNITY): Payer: BLUE CROSS/BLUE SHIELD | Attending: Internal Medicine

## 2017-04-06 DIAGNOSIS — Z87891 Personal history of nicotine dependence: Secondary | ICD-10-CM | POA: Diagnosis not present

## 2017-04-06 DIAGNOSIS — Z6836 Body mass index (BMI) 36.0-36.9, adult: Secondary | ICD-10-CM | POA: Insufficient documentation

## 2017-04-06 DIAGNOSIS — E669 Obesity, unspecified: Secondary | ICD-10-CM | POA: Diagnosis not present

## 2017-04-06 DIAGNOSIS — I071 Rheumatic tricuspid insufficiency: Secondary | ICD-10-CM | POA: Diagnosis not present

## 2017-04-06 DIAGNOSIS — R0789 Other chest pain: Secondary | ICD-10-CM

## 2017-04-13 ENCOUNTER — Other Ambulatory Visit: Payer: Self-pay | Admitting: Internal Medicine

## 2017-04-14 NOTE — Telephone Encounter (Signed)
Please advise, not info on [facility]

## 2017-04-14 NOTE — Telephone Encounter (Signed)
Medication printed to be faxed.

## 2017-05-13 ENCOUNTER — Telehealth: Payer: Self-pay | Admitting: Family

## 2017-05-14 NOTE — Telephone Encounter (Signed)
Unable to find recent refills in the database. You last filled on 04/14/17. Please advise

## 2017-05-15 NOTE — Telephone Encounter (Addendum)
Please resend, pharmacy did not receive.  Was faxed to wrong pharmacy.

## 2017-05-15 NOTE — Telephone Encounter (Signed)
[facility]. Had to leave refill on pharmacist vm...Kathleen Sullivan

## 2017-06-12 ENCOUNTER — Other Ambulatory Visit: Payer: Self-pay | Admitting: Family

## 2017-06-15 ENCOUNTER — Telehealth: Payer: Self-pay | Admitting: Internal Medicine

## 2017-06-15 NOTE — Telephone Encounter (Signed)
Please advise thanks.

## 2017-06-15 NOTE — Telephone Encounter (Signed)
Patient called in reference to needing labs done. Patient wants to know if orders that are in are still good or do they need to be re ordered. Patient also wants to know where she needs to go to have the labs done. Please call patient and advise. OK to leave message.

## 2017-06-15 NOTE — Telephone Encounter (Signed)
Rx faxed

## 2017-06-15 NOTE — Telephone Encounter (Signed)
Pt is aware she can go to [facility] for labs and the current orders are still ok to use

## 2017-08-10 ENCOUNTER — Other Ambulatory Visit: Payer: Self-pay | Admitting: Internal Medicine

## 2017-08-11 NOTE — Telephone Encounter (Signed)
[facility] [address]

## 2017-08-14 ENCOUNTER — Encounter: Payer: Self-pay | Admitting: Internal Medicine

## 2017-08-14 ENCOUNTER — Ambulatory Visit (INDEPENDENT_AMBULATORY_CARE_PROVIDER_SITE_OTHER): Payer: BLUE CROSS/BLUE SHIELD | Admitting: Internal Medicine

## 2017-08-14 VITALS — BP 116/70 | HR 66 | Temp 98.2°F | Wt 160.0 lb

## 2017-08-14 DIAGNOSIS — E538 Deficiency of other specified B group vitamins: Secondary | ICD-10-CM | POA: Diagnosis not present

## 2017-08-14 DIAGNOSIS — E559 Vitamin D deficiency, unspecified: Secondary | ICD-10-CM | POA: Diagnosis not present

## 2017-08-14 DIAGNOSIS — Z23 Encounter for immunization: Secondary | ICD-10-CM

## 2017-08-14 DIAGNOSIS — E2749 Other adrenocortical insufficiency: Secondary | ICD-10-CM

## 2017-08-14 NOTE — Progress Notes (Signed)
Patient ID: Kathleen Sullivan, female   DOB: [birth year], 41 y.o.   MRN: [id number]  [name] is a 41 y.o. 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 Last visit 6 months ago.  She lost 19 lbs in last 5 months - she is very happy about this.  She has more energy and less temperature variability.  Patient is currently on hydrocortisone 10 mg in a.m. and 5 mg in p.m. for steroid replacement.  She is due for a cosyntropin stimulation test to see if her hypothalamus-pituitary-adrenal axis recovered.  We did not check this at last visit since she was complaining about temperature fluctuations including cold intolerance in a.m. and hot flashes later in the day.  She also complained about fatigue, eczema and brain fog.  We checked several tests at last visit including a CBC, vitamin B12, and vitamin D.  She was found to have low B12 and vitamin D and we started replacement with these. She fees better and eczema is better also. Periods are regular.  She is on Avocado-Soy-unsaponifiable for knee arthritis.  Reviewed and addended history: Pt described that she started to have a low body temperature in ~2013. She was feeling that she had a fever when her body temp was ~30F. She had cold sweats. During the investigation for low body temperature, she was found to have a low cortisol level:  Component     Latest Ref Rng 02/21/2014 03/03/2014  Cortisol - AM     4.3 - 22.4 ug/dL 1.3 (L) 3.2 (L)  C206 ACTH     6 - 50 pg/mL  8  She had one cortisone inj in the knee - 8 years before her central adrenal insufficiency diagnosis. She was off the steroid nasal spray long before the test.  She continues on Gabapentin 300 mg bid - for nerve damage from a hernia surgery >> cannot decrease to less than 1x a day 2/2 nerve pain  No history of hypothyroidism:  Lab Results  Component Value Date   TSH 3.31 02/12/2017   TSH 1.51 02/21/2014   TSH 1.15  03/16/2009   FREET4 0.80 02/12/2017   FREET4 0.84 02/21/2014   The rest of the pituitary w/u was normal: Component     Latest Ref Rng 03/22/2014  Estradiol, Free      2.76  Estradiol      138  Results received      03/29/14  FSH      4.7  LH      5.35  Prolactin      15.7  Somatomedin (IGF-I)     66 - 336 ng/mL 121  Growth Hormone     0.00 - 8.00 ng/mL <0.10   Pituitary MRI (04/18/2014): No pituitary tumor   Review of symptoms: Constitutional: ++ weight loss, no fatigue, no subjective hyperthermia, no subjective hypothermia Eyes: no blurry vision, no xerophthalmia ENT: no sore throat, no nodules palpated in throat, no dysphagia, no odynophagia, no hoarseness Cardiovascular: no CP/no SOB/no palpitations/no leg swelling Respiratory: no cough/no SOB/no wheezing Gastrointestinal: no N/no V/no D/no C/no acid reflux Musculoskeletal: + muscle aches/+ joint aches Skin: no rashes, + hair growth - excess Neurological: no tremors/no numbness/no tingling/no dizziness  I reviewed pt's medications, allergies, PMH, social hx, family hx, and changes were documented in the history of present illness. Otherwise, unchanged from my initial visit note.  Physical exam: BP 116/70   Pulse 66   Temp 98.2 F (36.8  C) (Oral)   Wt 160 lb (72.6 kg)   LMP 07/20/2017   SpO2 97%   BMI 32.32 kg/m  Body mass index is 32.32 kg/m.  Wt Readings from Last 3 Encounters:  08/14/17 160 lb (72.6 kg)  03/24/17 179 lb 12.8 oz (81.6 kg)  02/25/17 179 lb (81.2 kg)   Constitutional: overweight, in NAD Eyes: PERRLA, EOMI, no exophthalmos ENT: moist mucous membranes, no thyromegaly, no cervical lymphadenopathy Cardiovascular: RRR, No MRG Respiratory: CTA B Gastrointestinal: abdomen soft, NT, ND, BS+ Musculoskeletal: no deformities, strength intact in all 4 Skin: moist, warm, no rashes Neurological: no tremor with outstretched hands, DTR normal in all 4  Assessment: 1. Central adrenal  insufficiency - likely 2/2 previous steroid inj  2.  Vitamin D deficiency  3.  Low vitamin B12  Plan:  1. AI - Pt has a history of iatrogenic adrenal insufficiency, currently on hydrocortisone replacement with 10 mg in a.m. and 5 mg in p.m.  She did not have to increase the dosage of the hydrocortisone or get IV steroids since last visit. - We will need to check another stimulation test to see if we can stop the hydrocortisone or decrease the dose; she will need to return for this in a.m..,  Fasting - We again discussed about sick days rules: - You absolutely need to take this medication every day and not skip doses. - Please double the dose if you have a fever, for the duration of the fever. - If you cannot take anything by mouth (vomiting) or you have severe diarrhea so that you eliminate the hydrocortisone pills in your stool, please make sure that you get the hydrocortisone in the vein instead - go to the nearest emergency department/urgent care or you may go to your PCPs office  - Please try to get a MedAlert bracelet or pendant indicating: "Adrenal insufficiency".  2.  Vitamin D deficiency - Vitamin D very low at last visit so we started 5000 units vitamin D daily.  She continues this today. Feeling much better. - will check a new level at next lab draw  3.  Low vitamin B12 - Vitamin B12 was low normal at last visit so we started 5000 mcg B12 daily.  She continues this today. Feeling much better. - will check a new level at next lab draw  Orders Placed This Encounter  Procedures  . ACTH  . Cortisol  . Cortisol  . Cortisol  . VITAMIN D 25 Hydroxy (Vit-D Deficiency, Fractures)  . Vitamin B12    Philemon Kingdom, MD PhD [facility] Endocrinology

## 2017-08-14 NOTE — Patient Instructions (Signed)
Please come back for a stimulation test at 8 am fasting.  For now, continue hydrocortisone 10 mg in am and 5 mg in pm.  Please start hydrocortisone 10 mg in a.m. and 5 mg in p.m. (no later than 6 PM).  - You absolutely need to take this medication every day and not skip doses. - Please double the dose if you have a fever, for the duration of the fever. - If you cannot take anything by mouth (vomiting) or you have severe diarrhea so that you eliminate the hydrocortisone pills in your stool, please make sure that you get the hydrocortisone in the vein instead - go to the nearest emergency department/urgent care or you may go to your PCPs office  - Please try to get a MedAlert bracelet or pendant indicating: "Adrenal insufficiency".  Please continue B12 5000 mcg daily, vitamin D 5000 IU.  Please come back for a follow-up appointment in 6 months.

## 2017-08-17 ENCOUNTER — Other Ambulatory Visit: Payer: Self-pay | Admitting: Internal Medicine

## 2017-08-17 ENCOUNTER — Other Ambulatory Visit: Payer: BLUE CROSS/BLUE SHIELD

## 2017-08-30 ENCOUNTER — Other Ambulatory Visit: Payer: Self-pay | Admitting: Internal Medicine

## 2017-09-01 ENCOUNTER — Other Ambulatory Visit: Payer: Self-pay | Admitting: Internal Medicine

## 2017-09-11 ENCOUNTER — Other Ambulatory Visit: Payer: Self-pay | Admitting: Internal Medicine

## 2017-09-11 NOTE — Telephone Encounter (Signed)
Cannot get refilled. She no showed for her visit and was informed last month she needed visit for refills.

## 2017-09-11 NOTE — Telephone Encounter (Signed)
Pt stated she contacted pharmacy on Monday & today is Friday and she is now out of her XANEX, and really needs this filled. [facility] advise[facility] [address]

## 2017-09-16 ENCOUNTER — Telehealth: Payer: Self-pay | Admitting: Internal Medicine

## 2017-09-16 NOTE — Telephone Encounter (Signed)
Refill has been denied due to needing appt. Per chart pt has appt w/MD 09/17/17...Kathleen Sullivan

## 2017-09-16 NOTE — Addendum Note (Signed)
Addended by: Earnstine Regal on: 09/16/2017 04:00 PM   Modules accepted: Orders

## 2017-09-16 NOTE — Telephone Encounter (Signed)
Routed to [facility]; request for controlled substance

## 2017-09-17 ENCOUNTER — Other Ambulatory Visit (INDEPENDENT_AMBULATORY_CARE_PROVIDER_SITE_OTHER): Payer: BLUE CROSS/BLUE SHIELD

## 2017-09-17 ENCOUNTER — Encounter: Payer: Self-pay | Admitting: Internal Medicine

## 2017-09-17 ENCOUNTER — Ambulatory Visit (INDEPENDENT_AMBULATORY_CARE_PROVIDER_SITE_OTHER): Payer: BLUE CROSS/BLUE SHIELD | Admitting: Internal Medicine

## 2017-09-17 VITALS — BP 112/70 | HR 71 | Temp 98.8°F | Ht 59.0 in | Wt 156.0 lb

## 2017-09-17 DIAGNOSIS — E559 Vitamin D deficiency, unspecified: Secondary | ICD-10-CM

## 2017-09-17 DIAGNOSIS — J4520 Mild intermittent asthma, uncomplicated: Secondary | ICD-10-CM | POA: Diagnosis not present

## 2017-09-17 DIAGNOSIS — E538 Deficiency of other specified B group vitamins: Secondary | ICD-10-CM

## 2017-09-17 DIAGNOSIS — E785 Hyperlipidemia, unspecified: Secondary | ICD-10-CM

## 2017-09-17 DIAGNOSIS — F3112 Bipolar disorder, current episode manic without psychotic features, moderate: Secondary | ICD-10-CM

## 2017-09-17 DIAGNOSIS — G479 Sleep disorder, unspecified: Secondary | ICD-10-CM | POA: Diagnosis not present

## 2017-09-17 LAB — LIPID PANEL
CHOLESTEROL: 154 mg/dL (ref 0–200)
HDL: 37.2 mg/dL — AB (ref >39.00)
LDL Cholesterol: 84 mg/dL (ref 0–99)
NONHDL: 116.93
Total CHOL/HDL Ratio: 4
Triglycerides: 164 mg/dL — ABNORMAL HIGH (ref 0.0–149.0)
VLDL: 32.8 mg/dL (ref 0.0–40.0)

## 2017-09-17 LAB — COMPREHENSIVE METABOLIC PANEL
ALT: 8 U/L (ref 0–35)
AST: 10 U/L (ref 0–37)
Albumin: 4.3 g/dL (ref 3.5–5.2)
Alkaline Phosphatase: 47 U/L (ref 39–117)
BILIRUBIN TOTAL: 0.6 mg/dL (ref 0.2–1.2)
BUN: 10 mg/dL (ref 6–23)
CO2: 28 mEq/L (ref 19–32)
Calcium: 9.1 mg/dL (ref 8.4–10.5)
Chloride: 105 mEq/L (ref 96–112)
Creatinine, Ser: 0.82 mg/dL (ref 0.40–1.20)
GFR: 81.31 mL/min (ref >60.00)
GLUCOSE: 85 mg/dL (ref 70–99)
Potassium: 3.8 mEq/L (ref 3.5–5.1)
Sodium: 142 mEq/L (ref 135–145)
TOTAL PROTEIN: 7.1 g/dL (ref 6.0–8.3)

## 2017-09-17 LAB — VITAMIN D 25 HYDROXY (VIT D DEFICIENCY, FRACTURES): VITD: 49.49 ng/mL (ref 30.00–100.00)

## 2017-09-17 LAB — VITAMIN B12: VITAMIN B 12: 1442 pg/mL — AB (ref 211–911)

## 2017-09-17 MED ORDER — ALBUTEROL SULFATE 108 (90 BASE) MCG/ACT IN AEPB: 1.0000 | INHALATION_SPRAY | Freq: Four times a day (QID) | RESPIRATORY_TRACT | 3 refills | Status: DC

## 2017-09-17 MED ORDER — ALPRAZOLAM 0.5 MG PO TABS: 0.5000 mg | ORAL_TABLET | Freq: Two times a day (BID) | ORAL | 5 refills | Status: DC | PRN

## 2017-09-17 NOTE — Assessment & Plan Note (Signed)
Taking belsomra and xanax for sleep problems. Reminded about the risk of dependence and addiction to xanax and risk of increased falls, memory changes.

## 2017-09-17 NOTE — Assessment & Plan Note (Signed)
Not seeing anyone as she does not feel that this is a problem at this time. If recurrence of mania or depression needs to see psych.

## 2017-09-17 NOTE — Assessment & Plan Note (Signed)
Checking B12 level today and is taking B12 oral replacement.

## 2017-09-17 NOTE — Assessment & Plan Note (Signed)
Refill albuterol inhaler and taking singulair as well. She is planning to move to another home soon to help with her allergies.

## 2017-09-17 NOTE — Patient Instructions (Signed)
We will check the labs today and send you the results.   We have sent in the refills today.  Come back for the physical at your next visit in 6 months.

## 2017-09-17 NOTE — Assessment & Plan Note (Signed)
Checking vitamin D level today.

## 2017-09-17 NOTE — Progress Notes (Signed)
   Subjective:    Patient ID: Kathleen Sullivan, female    DOB: 10-Jan-1976, 41 y.o.   MRN: [id number]  HPI The patient is a 41 YO female coming in for follow up of several medical conditions including her anxiety (taking xanax BID prn 0.5 mg and doing okay, has panic attacks at night time and has for a long time, history of bipolar 1 but not being seen for this, denies mania or depression currently), and her sleeping (using belsomra along with xanax at night time, still with about 45 minutes to sleep average, goes to sleep around 1-2 am and sleeps until 7-8 am usually, keeps same sleep structure on the weekends, uses music to help her stay asleep as well), and her asthma (needing refill on albuterol inhaler, some wheezing intermittent associated with sinus problems, they are moving soon due to possible mold in the air system at their current townhouse/apartment, denies SOB with exertion or increased albuterol usage lately). Also needs follow up of her B12/D levels since starting replacement about 6 months ago and no recheck. Feeling improved with less rash, more energy, weight loss.   Review of Systems  Constitutional: Negative.   HENT: Positive for congestion, postnasal drip and rhinorrhea. Negative for sneezing, sore throat, tinnitus and voice change.   Eyes: Negative.   Respiratory: Negative for cough, chest tightness and shortness of breath.   Cardiovascular: Negative for chest pain, palpitations and leg swelling.  Gastrointestinal: Negative for abdominal distention, abdominal pain, constipation, diarrhea, nausea and vomiting.  Musculoskeletal: Negative.   Skin: Negative.   Neurological: Negative.   Psychiatric/Behavioral: Positive for sleep disturbance. Negative for behavioral problems, confusion, decreased concentration, dysphoric mood, self-injury and suicidal ideas. The patient is nervous/anxious.       Objective:   Physical Exam  Constitutional: She is oriented to person, place, and  time. She appears well-developed and well-nourished.  HENT:  Head: Normocephalic and atraumatic.  Some tonsillar stones, redness oropharynx.   Eyes: EOM are normal.  Neck: Normal range of motion.  Cardiovascular: Normal rate and regular rhythm.  Pulmonary/Chest: Effort normal and breath sounds normal. No respiratory distress. She has no wheezes. She has no rales.  Abdominal: Soft. She exhibits no distension. There is no tenderness. There is no rebound.  Musculoskeletal: She exhibits no edema.  Neurological: She is alert and oriented to person, place, and time. Coordination normal.  Skin: Skin is warm and dry.  Psychiatric:  Speech slightly pressured but stable from prior   Vitals:   09/17/17 0829  BP: 112/70  Pulse: 71  Temp: 98.8 F (37.1 C)  TempSrc: Oral  SpO2: 99%  Weight: 156 lb (70.8 kg)  Height: 4' 11"  (1.499 m)      Assessment & Plan:

## 2017-11-27 ENCOUNTER — Other Ambulatory Visit: Payer: Self-pay | Admitting: Internal Medicine

## 2017-11-28 IMAGING — CR DG CHEST 2V
2 series · 2 of 2 positions shown · non-contrast
Comparison: 02/26/2014

CLINICAL DATA: Chest pain

EXAM:
CHEST  2 VIEW

[chest pa]
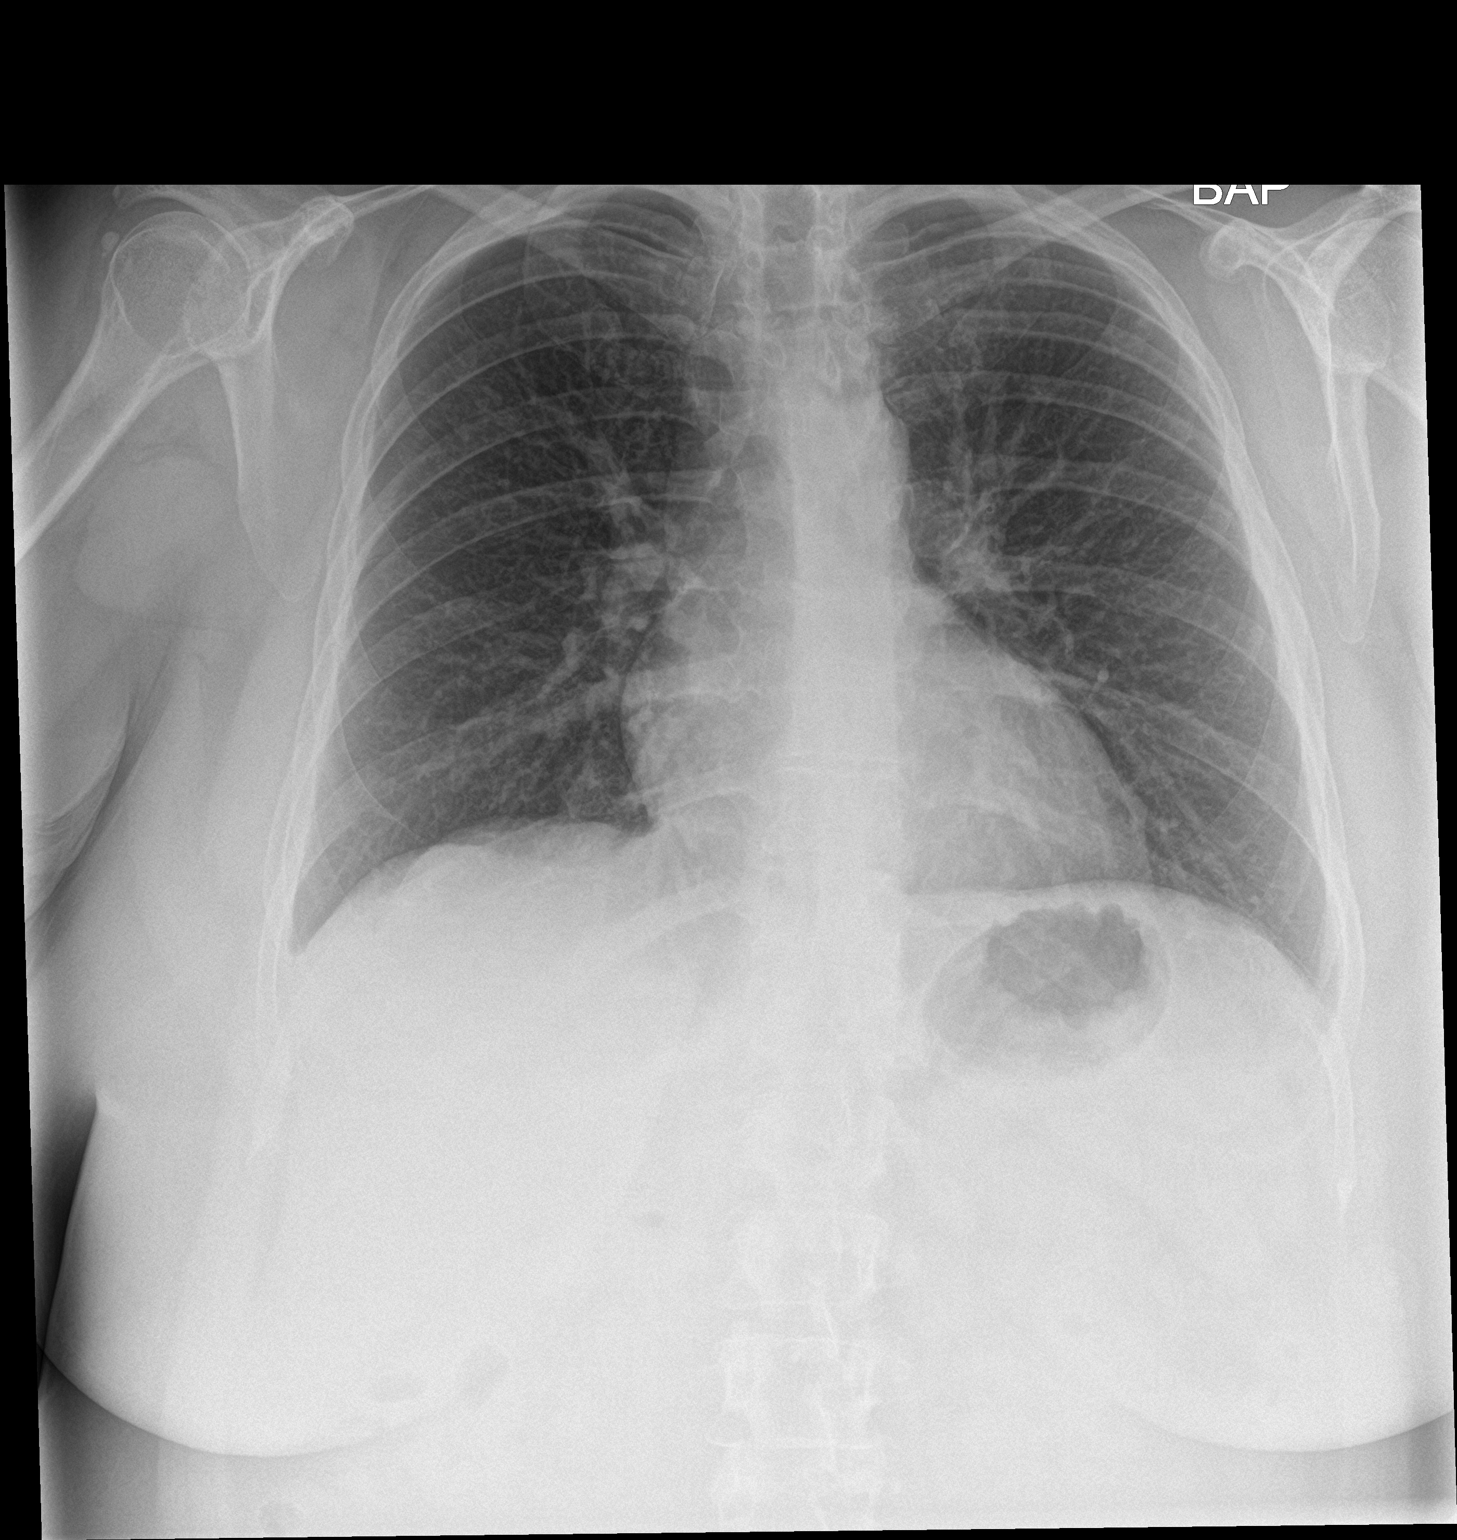

[chest lat]
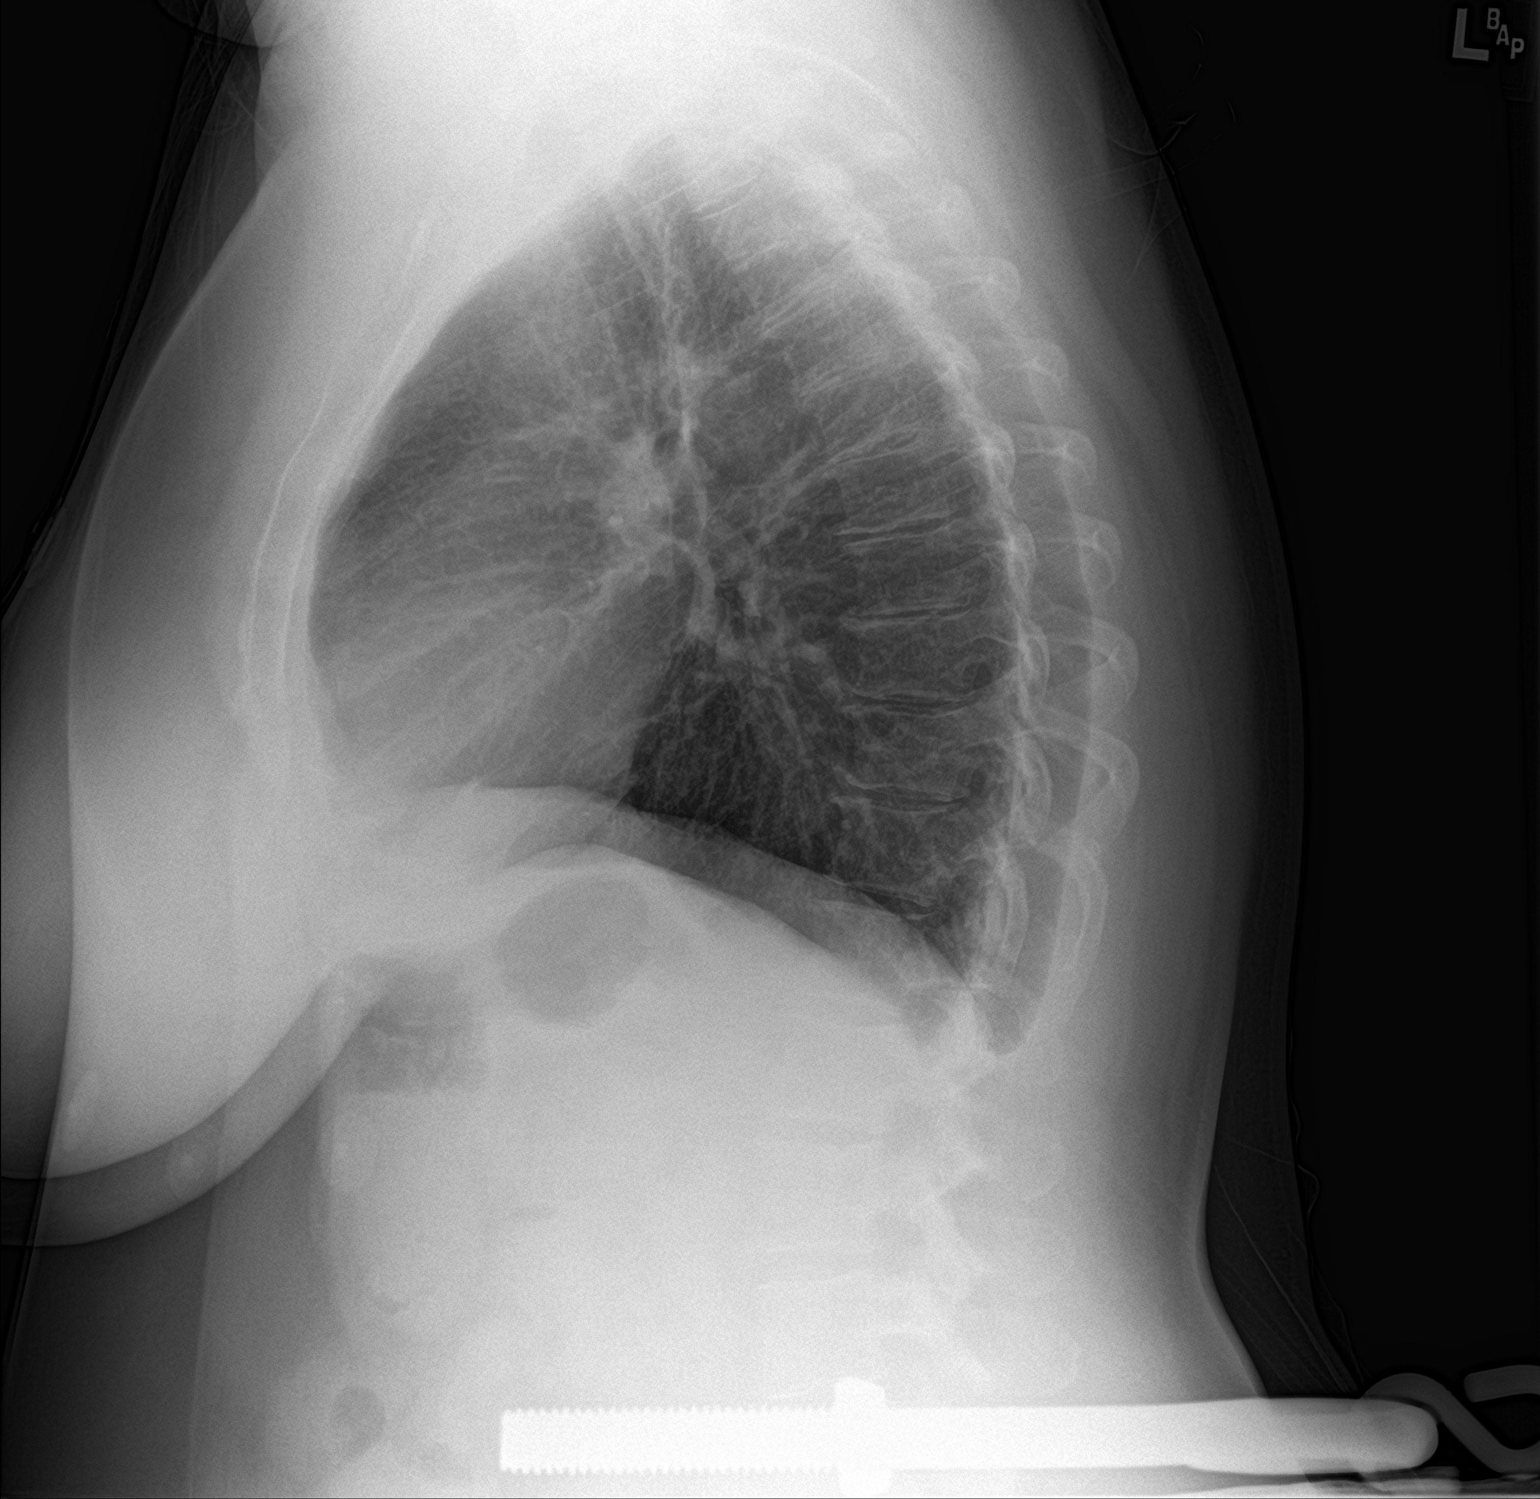

[2 of 2 positions shown; findings below may reference images not displayed]

FINDINGS: The heart size and mediastinal contours are within normal limits.
Both lungs are clear. Mild calcific tendinitis right shoulder.
IMPRESSION: No active cardiopulmonary disease.

## 2018-02-07 ENCOUNTER — Other Ambulatory Visit: Payer: Self-pay | Admitting: Internal Medicine

## 2018-02-09 ENCOUNTER — Other Ambulatory Visit: Payer: Self-pay | Admitting: Internal Medicine

## 2018-02-17 ENCOUNTER — Encounter: Payer: Self-pay | Admitting: Internal Medicine

## 2018-02-17 ENCOUNTER — Ambulatory Visit: Payer: BLUE CROSS/BLUE SHIELD | Admitting: Internal Medicine

## 2018-02-17 DIAGNOSIS — F988 Other specified behavioral and emotional disorders with onset usually occurring in childhood and adolescence: Secondary | ICD-10-CM

## 2018-02-17 MED ORDER — SUVOREXANT 15 MG PO TABS: 1.0000 | ORAL_TABLET | Freq: Every day | ORAL | 5 refills | Status: DC

## 2018-02-17 NOTE — Patient Instructions (Addendum)
We will have you go get tested again and see if they can find a good treatment for you. [facility] specialists can treat and diagnose this. [phone].

## 2018-02-17 NOTE — Progress Notes (Signed)
   Subjective:    Patient ID: Kathleen Sullivan, female    DOB: 1976/04/22, 42 y.o.   MRN: [id number]  HPI The patient is a 42 YO female coming in for concerns about ADD. She had been diagnosed with ADD back in 2013 but she elected not to have treatment. She has several siblings with ADD and she has taken some of their meds over the years. She has had bad reactions with stimulants. 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 She has to be very careful with caffeine and coffee as this makes her very overactive more than usual. She got some relief with the adderall etc that she tried but most of the stimulants had more negative side effects. This is causing her memory to be poor and she is unable to accomplish tasks. She did fine in school and even through high school was organized and able to accomplish work ahead of time. She has been diagnosed with adrenal insufficiency and she thought that this was caused her symptoms but since her levels have been stable for some time her mental symptoms are not stable. She is distractible. She has not been prescribed anything for this.   Review of Systems  Constitutional: Negative.   Respiratory: Negative for cough, chest tightness and shortness of breath.   Cardiovascular: Negative for chest pain, palpitations and leg swelling.  Gastrointestinal: Negative for abdominal distention, abdominal pain, constipation, diarrhea, nausea and vomiting.  Musculoskeletal: Negative.   Skin: Negative.   Neurological: Negative.   Psychiatric/Behavioral: Positive for agitation, behavioral problems and decreased concentration. The patient is hyperactive.       Objective:   Physical Exam  Constitutional: She is oriented to person, place, and time. She appears well-developed and well-nourished.  HENT:  Head: Normocephalic and atraumatic.  Eyes: EOM are normal.  Neck: Normal range of motion.  Cardiovascular: Normal rate and regular rhythm.  Pulmonary/Chest: Effort  normal and breath sounds normal. No respiratory distress. She has no wheezes. She has no rales.  Abdominal: Soft. Bowel sounds are normal. She exhibits no distension. There is no tenderness. There is no rebound.  Musculoskeletal: She exhibits no edema.  Neurological: She is alert and oriented to person, place, and time. Coordination normal.  Skin: Skin is warm and dry.  Psychiatric: She has a normal mood and affect.  Thoughts are distracted during the visit   Vitals:   02/17/18 1019  BP: 100/70  Pulse: 71  Temp: 98.1 F (36.7 C)  TempSrc: Oral  SpO2: 98%  Weight: 147 lb (66.7 kg)  Height: 4' 11"  (1.499 m)      Assessment & Plan:

## 2018-02-18 DIAGNOSIS — F988 Other specified behavioral and emotional disorders with onset usually occurring in childhood and adolescence: Secondary | ICD-10-CM | POA: Insufficient documentation

## 2018-02-18 NOTE — Assessment & Plan Note (Signed)
She had some testing back in 2013 which was not extensive. There was some underlying anxiety then and with her hypomania I would like her to do testing to see if medical reasons may be causing her symptoms or if add is present. She should be under the care of psychiatry with the hypomania for safe treatment. Given information to call for testing and treatment. She declines any cognitive behavioral therapy as she feels too old to try that.

## 2018-03-04 ENCOUNTER — Other Ambulatory Visit: Payer: Self-pay | Admitting: Internal Medicine

## 2018-03-08 ENCOUNTER — Other Ambulatory Visit: Payer: Self-pay | Admitting: Internal Medicine

## 2018-03-08 DIAGNOSIS — F3112 Bipolar disorder, current episode manic without psychotic features, moderate: Secondary | ICD-10-CM

## 2018-03-08 MED ORDER — ALPRAZOLAM 0.5 MG PO TABS: 0.5000 mg | ORAL_TABLET | Freq: Two times a day (BID) | ORAL | 0 refills | Status: DC | PRN

## 2018-03-08 NOTE — Telephone Encounter (Signed)
Please advise in Dr. Nathanial Millman absence for refill request on Xanax 0.5 mg .  Thanks

## 2018-04-05 ENCOUNTER — Other Ambulatory Visit: Payer: Self-pay | Admitting: Internal Medicine

## 2018-04-05 DIAGNOSIS — F3112 Bipolar disorder, current episode manic without psychotic features, moderate: Secondary | ICD-10-CM

## 2018-04-06 ENCOUNTER — Other Ambulatory Visit: Payer: Self-pay | Admitting: Internal Medicine

## 2018-04-06 ENCOUNTER — Telehealth: Payer: Self-pay | Admitting: Internal Medicine

## 2018-04-06 DIAGNOSIS — F3112 Bipolar disorder, current episode manic without psychotic features, moderate: Secondary | ICD-10-CM

## 2018-04-06 MED ORDER — ALPRAZOLAM 0.5 MG PO TABS: 0.5000 mg | ORAL_TABLET | Freq: Two times a day (BID) | ORAL | 5 refills | Status: DC | PRN

## 2018-04-06 NOTE — Telephone Encounter (Signed)
Check Kathleen Sullivan registry last filled 03/08/2018.Marland KitchenJohny Chess

## 2018-04-06 NOTE — Telephone Encounter (Signed)
Control database checked last refill: 03/08/2018 LOV:02/17/2018

## 2018-04-06 NOTE — Telephone Encounter (Signed)
Copied from [place] [id number]. Topic: Quick Communication - Rx Refill/Question >> Apr 06, 2018  3:18 PM Boyd Kerbs wrote: Medication: ALPRAZolam Duanne Moron) 0.5 MG tablet  This was sent to Dr. Ronnald Ramp from last month and needs to go Dr Sharlet Salina This was submitted yesterday   Has the patient contacted their pharmacy? Yes.   (Agent: If no, request that the patient contact the pharmacy for the refill.) (Agent: If yes, when and what did the pharmacy advise?)  Preferred Pharmacy (with phone number or street name):  [facility] [place] [facility] [place] [address] [place] [address] Phone: [phone] Fax: [phone]    Agent: Please be advised that RX refills may take up to 3 business days. We ask that you follow-up with your pharmacy.

## 2018-04-06 NOTE — Telephone Encounter (Signed)
Pt sees Dr. Sharlet Salina,  Dr. Ronnald Ramp helped Dr Sharlet Salina last month.   Please send to Dr. Sharlet Salina for refill  xanax

## 2018-04-07 NOTE — Telephone Encounter (Signed)
MD approved and sent back electronically to pof.Marland KitchenJohny Chess

## 2018-05-03 ENCOUNTER — Telehealth: Payer: Self-pay | Admitting: Emergency Medicine

## 2018-05-03 MED ORDER — HYDROCORTISONE 5 MG PO TABS
ORAL_TABLET | ORAL | 0 refills | Status: DC
Start: 2018-05-03 — End: 2019-11-04

## 2018-05-03 NOTE — Telephone Encounter (Signed)
Pt called stated that her body temps are off and she needs to be seen for an acute visit. She also states that she needs a stimulation test. She said that she needs to be seen for this ASAP and wants to know if you can work her in. After I let her know I would have to send a message back to see if you can see her sooner she stated her medication is also going to run out and is going to need you to refill that as well at the appointment. Told patient we would give her a call back to let her know when you would be able to see her. Thanks.

## 2018-05-03 NOTE — Telephone Encounter (Signed)
Pt is aware that she is being treated for adrenal insufficiency and if her symptoms are related to this that she is already being treated. Pt will be here in the morning for the tests. Medication sent

## 2018-05-03 NOTE — Telephone Encounter (Signed)
Labs are in for the stim test: ACTH + 3 cortisol levels. She needs to come at 8 am fasting for the test. We can also refill her Hydrocortisone now. She can then schedule an appt first available.

## 2018-05-04 ENCOUNTER — Other Ambulatory Visit (INDEPENDENT_AMBULATORY_CARE_PROVIDER_SITE_OTHER): Payer: BLUE CROSS/BLUE SHIELD

## 2018-05-04 DIAGNOSIS — E538 Deficiency of other specified B group vitamins: Secondary | ICD-10-CM | POA: Diagnosis not present

## 2018-05-04 DIAGNOSIS — E2749 Other adrenocortical insufficiency: Secondary | ICD-10-CM

## 2018-05-04 DIAGNOSIS — E559 Vitamin D deficiency, unspecified: Secondary | ICD-10-CM | POA: Diagnosis not present

## 2018-05-04 LAB — CORTISOL
CORTISOL PLASMA: 11.9 ug/dL
CORTISOL PLASMA: 14.5 ug/dL
Cortisol, Plasma: 4.5 ug/dL

## 2018-05-04 LAB — VITAMIN D 25 HYDROXY (VIT D DEFICIENCY, FRACTURES): VITD: 38.95 ng/mL (ref 30.00–100.00)

## 2018-05-04 LAB — VITAMIN B12: Vitamin B-12: 1142 pg/mL — ABNORMAL HIGH (ref 211–911)

## 2018-05-04 MED ORDER — COSYNTROPIN NICU IV SYRINGE 0.25 MG/ML (STANDARD DOSE)
0.2500 mg | Freq: Once | INTRAVENOUS | Status: AC
  Administered 2018-05-04: 0.25 mg via INTRAVENOUS

## 2018-05-04 NOTE — Progress Notes (Signed)
Per orders of Dr. Cruzita Lederer injection of cortrosyn given today by Lolita Rieger RMA . Patient tolerated injection well.

## 2018-05-04 NOTE — Addendum Note (Signed)
Addended by: Kaylyn Lim I on: 05/04/2018 08:55 AM   Modules accepted: Orders

## 2018-05-04 NOTE — Addendum Note (Signed)
Addended by: Kaylyn Lim I on: 05/04/2018 09:24 AM   Modules accepted: Orders

## 2018-05-07 LAB — ACTH: C206 ACTH: 10 pg/mL (ref 6–50)

## 2018-05-09 ENCOUNTER — Encounter: Payer: Self-pay | Admitting: Internal Medicine

## 2018-06-29 ENCOUNTER — Other Ambulatory Visit: Payer: Self-pay | Admitting: Internal Medicine

## 2018-08-12 ENCOUNTER — Other Ambulatory Visit: Payer: Self-pay | Admitting: Internal Medicine

## 2018-08-12 NOTE — Telephone Encounter (Signed)
Control database checked last refill: 07/19/2018 LOV: 02/17/2018 [id number]

## 2018-08-26 ENCOUNTER — Other Ambulatory Visit: Payer: Self-pay | Admitting: Internal Medicine

## 2018-08-31 ENCOUNTER — Other Ambulatory Visit: Payer: Self-pay | Admitting: Obstetrics and Gynecology

## 2018-08-31 DIAGNOSIS — Z803 Family history of malignant neoplasm of breast: Secondary | ICD-10-CM

## 2018-09-26 ENCOUNTER — Other Ambulatory Visit: Payer: Self-pay | Admitting: Internal Medicine

## 2018-09-26 DIAGNOSIS — F3112 Bipolar disorder, current episode manic without psychotic features, moderate: Secondary | ICD-10-CM

## 2018-09-27 NOTE — Telephone Encounter (Signed)
Called pharmacy last refill 07/31/2018 LOV: 02/17/2018 [id number]

## 2018-10-22 ENCOUNTER — Telehealth: Payer: Self-pay | Admitting: Internal Medicine

## 2018-10-22 NOTE — Telephone Encounter (Signed)
Copied from [place]. 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  Pt wants to know if this can be increased, or if there is another med she can start. Pt has not been able to see the dentist in a year due to the having active herpes all the time. Pt states she has taken 4 a day and has worked. [facility] [place] [phone] (Phone) [phone] (Fax)  Please call pt to advise.

## 2018-10-22 NOTE — Telephone Encounter (Signed)
Please advise in the absence of dr crawford, thanks

## 2018-10-22 NOTE — Telephone Encounter (Signed)
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  Patient may need to consider seeing Dr Sharlet Salina again to review, but I would not change her medication based on this complaint

## 2018-10-25 NOTE — Telephone Encounter (Signed)
Patient can't come in tomorrow 1/21, and did not want to come in any other time for office visit, just wants medication changes made---patient has decided to call her endocrinologist to get more medications sent in--patient advised if she changes her mind, she will need office visit with dr crawford per other providers response below.

## 2018-10-25 NOTE — Telephone Encounter (Signed)
Ive tried to contact patient x2----getting voicemail---I need to talk with patient directly----I have blocked dr crawfords 10am 1/21 ---can patient come in for office visit----I will continue trying to contact patient to get appt scheduled

## 2018-10-25 NOTE — Telephone Encounter (Signed)
Patient called back again. She was upset over everything with needing an appointment or just having medication changed. I have informed if without Dr.Crawford being here the other days have to do what they think is best, which is her being seen. Patient refused to come in. But is willing to wait until tomorrow when Dr.Crawford is back and see if she will change the medication. She does believe she has been on this medication to long and it no longer seems to work. Please advise. Thank you.

## 2018-10-26 NOTE — Telephone Encounter (Signed)
With agree with prior stated opinion

## 2018-10-26 NOTE — Telephone Encounter (Signed)
LVM to inform patient that she would need to set up an appointment to discuss the medication change or even a dose change.

## 2018-10-27 ENCOUNTER — Ambulatory Visit: Payer: BLUE CROSS/BLUE SHIELD | Admitting: Nurse Practitioner

## 2018-10-27 ENCOUNTER — Encounter: Payer: Self-pay | Admitting: Nurse Practitioner

## 2018-10-27 ENCOUNTER — Other Ambulatory Visit: Payer: BLUE CROSS/BLUE SHIELD

## 2018-10-27 ENCOUNTER — Other Ambulatory Visit: Payer: Self-pay | Admitting: Internal Medicine

## 2018-10-27 VITALS — BP 130/70 | HR 91 | Ht 59.0 in | Wt 146.0 lb

## 2018-10-27 DIAGNOSIS — L130 Dermatitis herpetiformis: Secondary | ICD-10-CM

## 2018-10-27 DIAGNOSIS — R21 Rash and other nonspecific skin eruption: Secondary | ICD-10-CM | POA: Diagnosis not present

## 2018-10-27 MED ORDER — TRIAMCINOLONE ACETONIDE 0.025 % EX OINT: 1.0000 "application " | TOPICAL_OINTMENT | Freq: Two times a day (BID) | CUTANEOUS | 0 refills | Status: DC

## 2018-10-27 MED ORDER — VALACYCLOVIR HCL 1 G PO TABS: 1000.0000 mg | ORAL_TABLET | Freq: Two times a day (BID) | ORAL | 0 refills | Status: DC

## 2018-10-27 NOTE — Patient Instructions (Addendum)
Head downstairs for labs  You will be contacted about dermatology referral   Arm and hammer toothpaste Triamcinolone as directed. Gluten free diet  Please return for follow up in about 1 month.   Gluten-Free Diet for Celiac Disease, Adult  The gluten-free diet includes all foods that do not contain gluten. Gluten is a protein that is found in wheat, rye, barley, and some other grains. Following the gluten-free diet is the only treatment for people with celiac disease. It helps to prevent damage to the intestines and improves or eliminates the symptoms of celiac disease. Following the gluten-free diet requires some planning. It can be challenging at first, but it gets easier with time and practice. There are more gluten-free options available today than ever before. If you need help finding gluten-free foods or if you have questions, talk with your diet and nutrition specialist (registered dietitian) or your health care provider. What do I need to know about a gluten-free diet?  All fruits, vegetables, and meats are safe to eat and do not contain gluten.  When grocery shopping, start by shopping in the produce, meat, and dairy sections. These sections are more likely to contain gluten-free foods. Then move to the aisles that contain packaged foods if you need to.  Read all food labels. Gluten is often added to foods. Always check the ingredient list and look for warnings, such as "may contain gluten."  Talk with your dietitian or health care provider before taking a gluten-free multivitamin or mineral supplement.  Be aware of gluten-free foods having contact with foods that contain gluten (cross-contamination). This can happen at home and with any processed foods. ? Talk with your health care provider or dietitian about how to reduce the risk of cross-contamination in your home. ? If you have questions about how a food is processed, ask the manufacturer. What key words help to identify  gluten? Foods that list any of these key words on the label usually contain gluten:  Wheat, flour, enriched flour, bromated flour, white flour, durum flour, graham flour, phosphated flour, self-rising flour, semolina, farina, barley (malt), rye, and oats.  Starch, dextrin, modified food starch, or cereal.  Thickening, fillers, or emulsifiers.  Malt flavoring, malt extract, or malt syrup.  Hydrolyzed vegetable protein. In the [place] packaged foods that are gluten-free are required to be labeled "GF." These foods should be easy to identify and are safe to eat. In the [place] food companies are also required to list common food allergens, including wheat, on their labels. Recommended foods Grains  Amaranth, bean flours, 100% buckwheat flour, corn, millet, nut flours or nut meals, GF oats, quinoa, rice, sorghum, teff, rice wafers, pure cornmeal tortillas, popcorn, and hot cereals made from cornmeal. Hominy, rice, wild rice. Some Asian rice noodles or bean noodles. Arrowroot starch, corn bran, corn flour, corn germ, cornmeal, corn starch, potato flour, potato starch flour, and rice bran. Plain, brown, and sweet rice flours. Rice polish, soy flour, and tapioca starch. Vegetables  All plain fresh, frozen, and canned vegetables. Fruits  All plain fresh, frozen, canned, and dried fruits, and 100% fruit juices. Meats and other protein foods  All fresh beef, pork, poultry, fish, seafood, and eggs. Fish canned in water, oil, brine, or vegetable broth. Plain nuts and seeds, peanut butter. Some lunch meat and some frankfurters. Dried beans, dried peas, and lentils. Dairy  Fresh plain, dry, evaporated, or condensed milk. Cream, butter, sour cream, whipping cream, and most yogurts. Unprocessed cheese, most processed cheeses, some cottage  cheese, some cream cheeses. Beverages  Coffee, tea, most herbal teas. Carbonated beverages and some root beers. Wine, sake, and distilled spirits, such as gin, vodka,  and whiskey. Most hard ciders. Fats and oils  Butter, margarine, vegetable oil, hydrogenated butter, olive oil, shortening, lard, cream, and some mayonnaise. Some commercial salad dressings. Olives. Sweets and desserts  Sugar, honey, some syrups, molasses, jelly, and jam. Plain hard candy, marshmallows, and gumdrops. Pure cocoa powder. Plain chocolate. Custard and some pudding mixes. Gelatin desserts, sorbets, frozen ice pops, and sherbet. Cake, cookies, and other desserts prepared with allowed flours. Some commercial ice creams. Cornstarch, tapioca, and rice puddings. Seasoning and other foods  Some canned or frozen soups. Monosodium glutamate (MSG). Cider, rice, and wine vinegar. Baking soda and baking powder. Cream of tartar. Baking and nutritional yeast. Certain soy sauces made without wheat (ask your dietitian about specific brands that are allowed). Nuts, coconut, and chocolate. Salt, pepper, herbs, spices, flavoring extracts, imitation or artificial flavorings, natural flavorings, and food colorings. Some medicines and supplements. Some lip glosses and other cosmetics. Rice syrups. The items listed may not be a complete list. Talk with your dietitian about what dietary choices are best for you. Foods to avoid Grains  Barley, bran, bulgur, couscous, cracked wheat, [place], farro, graham, malt, matzo, semolina, wheat germ, and all wheat and rye cereals including spelt and kamut. Cereals containing malt as a flavoring, such as rice cereal. Noodles, spaghetti, macaroni, most packaged rice mixes, and all mixes containing wheat, rye, barley, or triticale. Vegetables  Most creamed vegetables and most vegetables canned in sauces. Some commercially prepared vegetables and salads. Fruits  Thickened or prepared fruits and some pie fillings. Some fruit snacks and fruit roll-ups. Meats and other protein foods  Any meat or meat alternative containing wheat, rye, barley, or gluten stabilizers. These  are often marinated or packaged meats and lunch meats. Bread-containing products, such as Swiss steak, croquettes, meatballs, and meatloaf. Most tuna canned in vegetable broth and [place] with hydrolyzed vegetable protein (HVP) injected as part of the basting. [place]. Imitation fish. Eggs in sauces made from ingredients to avoid. Dairy  Commercial chocolate milk drinks and malted milk. Some non-dairy creamers. Any cheese product containing ingredients to avoid. Beverages  Certain cereal beverages. Beer, ale, malted milk, and some root beers. Some hard ciders. Some instant flavored coffees. Some herbal teas made with barley or with barley malt added. Fats and oils  Some commercial salad dressings. Sour cream containing modified food starch. Sweets and desserts  Some toffees. Chocolate-coated nuts (may be rolled in wheat flour) and some commercial candies and candy bars. Most cakes, cookies, donuts, pastries, and other baked goods. Some commercial ice cream. Ice cream cones. Commercially prepared mixes for cakes, cookies, and other desserts. Bread pudding and other puddings thickened with flour. Products containing brown rice syrup made with barley malt enzyme. Desserts and sweets made with malt flavoring. Seasoning and other foods  Some curry powders, some dry seasoning mixes, some gravy extracts, some meat sauces, some ketchups, some prepared mustards, and horseradish. Certain soy sauces. Malt vinegar. Bouillon and bouillon cubes that contain HVP. Some chip dips, and some chewing gum. Yeast extract. Brewer's yeast. Caramel color. Some medicines and supplements. Some lip glosses and other cosmetics. The items listed may not be a complete list. Talk with your dietitian about what dietary choices are best for you. Summary  Gluten is a protein that is found in wheat, rye, barley, and some other grains. The gluten-free diet includes  all foods that do not contain gluten.  If you need help finding  gluten-free foods or if you have questions, talk with your diet and nutrition specialist (registered dietitian) or your health care provider.  Read all food labels. Gluten is often added to foods. Always check the ingredient list and look for warnings, such as "may contain gluten." This information is not intended to replace advice given to you by your health care provider. Make sure you discuss any questions you have with your health care provider. Document Released: 09/22/2005 Document Revised: 07/07/2016 Document Reviewed: 07/07/2016 Elsevier Interactive Patient Education  2019 [employer].

## 2018-10-27 NOTE — Progress Notes (Signed)
Kathleen Sullivan is a 43 y.o. female with the following history as recorded in Epic[facility]:  Patient Active Problem List   Diagnosis Date Noted  . ADD (attention deficit disorder) 02/18/2018  . Vitamin D deficiency 08/14/2017  . Low vitamin B12 level 08/14/2017  . Secondary adrenal insufficiency ([place]) 03/22/2014  . Acne 12/27/2013  . Routine health maintenance 12/26/2012  . Sleep disorder 11/21/2011  . Hallux rigidus 04/23/2009  . VENEREAL WART 10/15/2007  . Asthma 10/15/2007  . GASTROESOPHAGEAL REFLUX DISEASE 10/15/2007  . ALLERGY 10/15/2007  . Bipolar 1 disorder, manic, moderate ([place]) 10/01/2007    Current Outpatient Medications  Medication Sig Dispense Refill  . Albuterol Sulfate (PROAIR RESPICLICK) 570 (90 Base) MCG/ACT AEPB Inhale 1 puff into the lungs 4 (four) times daily. NEED ANNUAL APPOINTMENT FOR FURTHER REFILLS 1 each 3  . ALPRAZolam (XANAX) 0.5 MG tablet TAKE ONE TABLET BY MOUTH TWICE A DAY AS NEEDED FOR ANXIETY 60 tablet 2  . BELSOMRA 15 MG TABS TAKE 1 TABLET BY MOUTH EVERYDAY AT BEDTIME 30 tablet 5  . cholecalciferol (VITAMIN D) 1000 units tablet Take 6,000 Units by mouth daily.    . cyclobenzaprine (FLEXERIL) 5 MG tablet     . fexofenadine (ALLEGRA) 180 MG tablet Take 180 mg by mouth daily.    Marland Kitchen gabapentin (NEURONTIN) 300 MG capsule Take 300 mg by mouth 2 (two) times daily.    Marland Kitchen HYDROcodone-acetaminophen (NORCO/VICODIN) 5-325 MG tablet     . hydrocortisone (CORTEF) 20 MG tablet Take 20 mg by mouth as directed.     . hydrocortisone 2.5 % cream Apply 1 application topically 2 (two) times daily as needed (Use as directed).    . hydrocortisone sodium succinate (SOLU-CORTEF) 100 MG SOLR injection Inject in the muscle 100 mg as needed if you cannot take the hydrocortisone by mouth 5 each prn  . Lysine 1000 MG TABS Take 2 tablets by mouth daily.    . montelukast (SINGULAIR) 10 MG tablet TAKE 1 TABLET BY MOUTH EVERYDAY AT BEDTIME 90 tablet 2  . Multiple Vitamin (MULTIVITAMIN)  tablet Take 1 tablet by mouth daily.    . Olopatadine HCl 0.2 % SOLN Apply 2 drops to eye every 4 (four) hours as needed (Use as directed).    Marland Kitchen oxyCODONE-acetaminophen (PERCOCET/ROXICET) 5-325 MG tablet     . SYRINGE-NEEDLE, DISP, 3 ML (B-D SYRINGE/NEEDLE 3CC/25GX5/8) 25G X 5/8" 3 ML MISC Use as needed 50 each 0  . vitamin B-12 (CYANOCOBALAMIN) 1000 MCG tablet Take 5,000 mcg daily by mouth.    . Water For Inject Bact Parabens (STERILE WATER, BACTERIOSTATIC,) SOLN injection Please dissolve the hydrocortisone solution and inject 100 mg in the muscle as needed 90 mL prn  . hydrocortisone (CORTEF) 5 MG tablet TAKE 2 TABLETS BY MOUTH IN THE MORNING TAKE 1 TABLET BY MOUTH IN THE EVENING NO LATER THAN 6 PM (Patient not taking: Reported on 10/27/2018) 270 tablet 0  . triamcinolone (KENALOG) 0.025 % ointment Apply 1 application topically 2 (two) times daily. 30 g 0  . Triamcinolone Acetonide (TRIAMCINOLONE 0.1 % CREAM : EUCERIN) CREA Apply 1 application topically as directed.     . valACYclovir (VALTREX) 1000 MG tablet Take 1 tablet (1,000 mg total) by mouth 2 (two) times daily. Needs to follow up with PCP for further refills 180 tablet 0   No current facility-administered medications for this visit.     Allergies: Diflucan [fluconazole]; Metoclopramide hcl; and Blain Pais allergy]  Past Medical History:  Diagnosis Date  .  Adrenal failure ([place])    takes solucortef daily  . Adrenal insufficiency ([place])   . Allergy, unspecified not elsewhere classified   . Anxiety   . Atypical chest pain   . Condyloma acuminatum   . Depression   . Esophageal reflux    takes OTC when needed  . Hallux rigidus   . Headache(784.0)   . Mild mitral regurgitation   . Mild tricuspid regurgitation   . Other malignant neoplasm without specification of site    skin cancer  . Panic disorder without agoraphobia   . Personal history of unspecified circulatory disease   . PVC's (premature ventricular contractions)   .  Unspecified asthma(493.90)    allergy induced asthma    Past Surgical History:  Procedure Laterality Date  . CESAREAN SECTION    . ESSURE TUBAL LIGATION    . FINGER SURGERY    . NASAL SEPTOPLASTY W/ TURBINOPLASTY N/A 06/07/2015   Procedure: NASAL SEPTOPLASTY WITH TURBINATE REDUCTION ;  Surgeon: Rozetta Nunnery, MD;  Location: [facility];  Service: ENT;  Laterality: N/A;    Family History  Problem Relation Age of Onset  . Breast cancer Mother   . Uterine cancer Mother   . Mental illness Mother   . Anxiety disorder Mother   . Diabetes Brother   . Breast cancer Other   . Coronary artery disease Other   . Stroke Other   . Diabetes Other   . Hypertension Other   . Colon cancer Neg Hx     Social History   Tobacco Use  . Smoking status: Former Research scientist (life sciences)  . Smokeless tobacco: Never Used  . Tobacco comment: >15 yrs ago.  Substance Use Topics  . Alcohol use: Yes    Comment: occasional     Subjective:  Kathleen Sullivan is here today requesting evaluation of oral herpes, has had oral herpes diagnosis for 14 years, taking maintenance dose of valtrex for some time now, however over past 5 months she says shes had increased outbreaks, 1-2 outbreaks per week, has been increasing her valtrex dosage during the outbreaks and now running out of the medication too soon, wondering if she should be switched to a new/different antiviral due to her increased outbreaks. She describes outbreaks as sores/cracks to lips and skin around lips, aside from taking valtrex she also applies carmex which helps some. She says outbreaks seemed to worsen/increase in frequency after diagnosis of  adrenal insufficiency. Her boyfriend has similar rash sometimes, they avoid contact when they have rash She denies fevers, chills, loss of appetite, nausea, vomiting, trouble swallowing or breathing.  She has not experienced genital herpes. She tries to follow good oral hygiene, but that does not seem to  help.  ROS- See HPI  Objective:  Vitals:   10/27/18 1600  BP: 130/70  Pulse: 91  SpO2: 97%  Weight: 146 lb (66.2 kg)  Height: 4' 11"  (1.499 m)    General: Well developed, well nourished, in no acute distress  Skin : Warm and dry. Dry chapped lips.  Head: Normocephalic and atraumatic  Eyes: Sclera and conjunctiva clear; pupils round and reactive to light; extraocular movements intact  Ears: External normal; canals clear; tympanic membranes normal  Oropharynx: Pink, supple. No suspicious lesions  Neck: Supple without thyromegaly, adenopathy  Lungs: effort unlabored, no respiratory distres CVS exam: normal rate and regular rhythm.  Extremities: No edema, cyanosis, clubbing  Vessels: Symmetric bilaterally  Neurologic: Alert and oriented; speech intact; face symmetrical; moves all extremities  well; CNII-XII intact without focal deficit  Psychiatric: Normal mood and affect.  Assessment:  1. Rash   2. Herpetiformis dermatitis     Plan:   Suspect ?herpetiformis dermatitis based on appearance of rash today/pictures in patients phone, she could also be experiencing herpes as well Discussed gluten free diet to see if symptoms improve, will also check celiac panel today Topical triamcinolone sent- medication dosing, side effects discussed Home management, oral hygiene, gluten diet, red flags and return precautions including when to seek immediate care discussed and printed on AVS Referral to dermatology for further evaluation She will RTC in 1 month for f/u, could consider stopping valtrex if rash resolves Reviewed this case with my supervising physician Dr Alain Marion  Return in about 1 month (around 11/27/2018) for f/u - rash.  Orders Placed This Encounter  Procedures  . Gliadin antibodies, serum    Standing Status:   Future    Number of Occurrences:   1    Standing Expiration Date:   10/28/2019  . Tissue transglutaminase, IgA    Standing Status:   Future    Number of Occurrences:    1    Standing Expiration Date:   10/28/2019  . Reticulin Antibody, IgA w reflex titer    Standing Status:   Future    Number of Occurrences:   1    Standing Expiration Date:   10/28/2019  . Ambulatory referral to Dermatology    Referral Priority:   Routine    Referral Type:   Consultation    Referral Reason:   Specialty Services Required    Requested Specialty:   Dermatology    Number of Visits Requested:   1    Requested Prescriptions   Signed Prescriptions Disp Refills  . triamcinolone (KENALOG) 0.025 % ointment 30 g 0    Sig: Apply 1 application topically 2 (two) times daily.

## 2018-10-30 LAB — RETICULIN ANTIBODIES, IGA W TITER: Reticulin IGA Screen: NEGATIVE

## 2018-10-30 LAB — TISSUE TRANSGLUTAMINASE, IGA: (tTG) Ab, IgA: 1 U/mL

## 2018-10-30 LAB — GLIADIN ANTIBODIES, SERUM
Gliadin IgA: 3 Units
Gliadin IgG: 1 Units

## 2018-11-01 ENCOUNTER — Encounter: Payer: Self-pay | Admitting: Nurse Practitioner

## 2018-11-15 ENCOUNTER — Telehealth: Payer: Self-pay | Admitting: Internal Medicine

## 2018-11-15 NOTE — Telephone Encounter (Signed)
Patient stated she was in to see the provider 9 months ago and doesn't know why her PROAIR RESPICLICK 259 (72 Base) MCG/ACT AEPB [Pharmacy Med Name: PROAIR RESPICLICK 90 MCG INHLR medication has been denied. She is completely out of the medication and needs this refill. She said if she needs to see her, she will come in, but she doesn't understand why she needs to come in. She is very frustrated.  Please advise.

## 2018-11-15 NOTE — Telephone Encounter (Signed)
Pt called but no answer at this time. Left message on voicemail that pt will need to return call to office to schedule an appt in order to receive refills of requested medication.

## 2018-11-15 NOTE — Telephone Encounter (Signed)
Amsi (husband) and pt called talk to [place].  in yelling and screaming that she refuses to make an appt for med refills. She stated that if she got so upset that cause her to go to hosp then "We" would pay for it.  She and her husband feel as if we are just trying to get money from them by having to come in for office visit for med refill.  He stated that he wanted me to send a message and he would be coming by the office to talk to practice admin about this situation.  They refused appt and wanted to send message for a call back.  She stated that if it was the same nurse that she spoke with 2 weeks ago and got "Pissed off at her"  Then she would be filling a complaint.     Best number [phone] [id number]

## 2018-11-16 MED ORDER — ALBUTEROL SULFATE 108 (90 BASE) MCG/ACT IN AEPB: 1.0000 | INHALATION_SPRAY | Freq: Four times a day (QID) | RESPIRATORY_TRACT | 0 refills | Status: DC

## 2018-11-16 NOTE — Telephone Encounter (Signed)
Okay to send in 1 inhaler proair without refills but will need visit as we have not addressed her overall health in more than 1 year. Also it is not okay for them to treat staff like this and if this behavior continues or happens again I will not be able to continue treating her.

## 2018-11-16 NOTE — Telephone Encounter (Signed)
LVM for Patient with MD response

## 2018-11-16 NOTE — Telephone Encounter (Signed)
Are you okay with this being refilled without a visit. I denied it because there has not been an annal visit or labs in over a year. Almyra Free has already seen this message as well

## 2018-11-16 NOTE — Addendum Note (Signed)
Addended by: Raford Pitcher R on: 11/16/2018 09:04 AM   Modules accepted: Orders

## 2018-12-10 ENCOUNTER — Encounter: Payer: Self-pay | Admitting: Internal Medicine

## 2018-12-16 ENCOUNTER — Other Ambulatory Visit: Payer: BLUE CROSS/BLUE SHIELD

## 2018-12-16 ENCOUNTER — Other Ambulatory Visit: Payer: Self-pay

## 2018-12-16 ENCOUNTER — Ambulatory Visit: Payer: BLUE CROSS/BLUE SHIELD | Admitting: Internal Medicine

## 2018-12-16 ENCOUNTER — Encounter: Payer: Self-pay | Admitting: Internal Medicine

## 2018-12-16 VITALS — BP 118/78 | HR 102 | Temp 98.3°F | Ht 59.0 in | Wt 145.0 lb

## 2018-12-16 DIAGNOSIS — R3915 Urgency of urination: Secondary | ICD-10-CM | POA: Diagnosis not present

## 2018-12-16 LAB — POCT URINALYSIS DIPSTICK
BILIRUBIN UA: NEGATIVE
Blood, UA: NEGATIVE
Glucose, UA: NEGATIVE
Ketones, UA: NEGATIVE
Nitrite, UA: NEGATIVE
Protein, UA: NEGATIVE
Spec Grav, UA: 1.01 (ref 1.010–1.025)
Urobilinogen, UA: 0.2 E.U./dL
pH, UA: 6 (ref 5.0–8.0)

## 2018-12-16 MED ORDER — NITROFURANTOIN MONOHYD MACRO 100 MG PO CAPS: 100.0000 mg | ORAL_CAPSULE | Freq: Two times a day (BID) | ORAL | 0 refills | Status: DC

## 2018-12-16 NOTE — Progress Notes (Signed)
   Subjective:   Patient ID: Kathleen Sullivan, female    DOB: 05-24-1976, 43 y.o.   MRN: [id number]  HPI The patient is a 43 YO female coming in for urinary tract infection symptoms. Started off and on over the last several months. She has been able to clear most with more liquids and cranberry juice. This time she has not been able to do so. She has had symptoms for about 4-5 days this time. She is having urgency and burning and some hesitancy with urination. No back pain, nausea, vomiting, fevers or chills.   Review of Systems  Constitutional: Negative.   Respiratory: Negative.   Cardiovascular: Negative.   Gastrointestinal: Negative for abdominal distention, abdominal pain, constipation, diarrhea, nausea and vomiting.  Genitourinary: Positive for difficulty urinating, dysuria, frequency and urgency.  Musculoskeletal: Negative.   Skin: Negative.     Objective:  Physical Exam Constitutional:      Appearance: She is well-developed.  HENT:     Head: Normocephalic and atraumatic.  Neck:     Musculoskeletal: Normal range of motion.  Cardiovascular:     Rate and Rhythm: Normal rate and regular rhythm.  Pulmonary:     Effort: Pulmonary effort is normal. No respiratory distress.     Breath sounds: Normal breath sounds. No wheezing or rales.  Abdominal:     General: Bowel sounds are normal. There is no distension.     Palpations: Abdomen is soft.     Tenderness: There is abdominal tenderness. There is no rebound.  Skin:    General: Skin is warm and dry.  Neurological:     Mental Status: She is alert and oriented to person, place, and time.     Coordination: Coordination normal.     Vitals:   12/16/18 1354  BP: 118/78  Pulse: (!) 102  Temp: 98.3 F (36.8 C)  TempSrc: Oral  SpO2: 98%  Weight: 145 lb (65.8 kg)  Height: 4' 11"  (1.499 m)    Assessment & Plan:

## 2018-12-16 NOTE — Patient Instructions (Signed)
We have sent in macrobid to take 1 pill twice a day for 1 week.

## 2018-12-17 DIAGNOSIS — R3915 Urgency of urination: Secondary | ICD-10-CM | POA: Insufficient documentation

## 2018-12-17 LAB — URINE CULTURE
MICRO NUMBER:: 311520
SPECIMEN QUALITY:: ADEQUATE

## 2018-12-17 NOTE — Assessment & Plan Note (Signed)
U/A done in the office and consistent with infection. Rx for macrobid and culture done due to symptoms off and on for several months.

## 2018-12-20 ENCOUNTER — Other Ambulatory Visit: Payer: Self-pay | Admitting: Nurse Practitioner

## 2018-12-20 ENCOUNTER — Other Ambulatory Visit: Payer: Self-pay | Admitting: Internal Medicine

## 2018-12-20 DIAGNOSIS — R21 Rash and other nonspecific skin eruption: Secondary | ICD-10-CM

## 2018-12-20 DIAGNOSIS — L130 Dermatitis herpetiformis: Secondary | ICD-10-CM

## 2018-12-20 DIAGNOSIS — F3112 Bipolar disorder, current episode manic without psychotic features, moderate: Secondary | ICD-10-CM

## 2018-12-21 ENCOUNTER — Other Ambulatory Visit: Payer: Self-pay | Admitting: Internal Medicine

## 2018-12-21 NOTE — Telephone Encounter (Signed)
Patient said that she is trying to get a script sent early to the pharmacy so that it will be there when she needs it in incase there becomes and issue with getting scripts sent and/or from the pharmacy.  She said that there was a problem with the automated refill at he pharmacy yesterday.   But she still does have some left.

## 2018-12-21 NOTE — Telephone Encounter (Signed)
Has she already used up albuterol in 1 month? If so needs visit to evaluate symptoms and overall control

## 2018-12-21 NOTE — Telephone Encounter (Signed)
Can you please make patient an appointment for annual visit

## 2018-12-21 NOTE — Telephone Encounter (Signed)
Control database checked last refill:11/24/2018  LOV: acute 12/16/2018 [id number]

## 2018-12-30 ENCOUNTER — Other Ambulatory Visit: Payer: Self-pay | Admitting: Internal Medicine

## 2018-12-31 ENCOUNTER — Encounter: Payer: Self-pay | Admitting: Internal Medicine

## 2018-12-31 NOTE — Telephone Encounter (Signed)
Patient is calling to request a medication refill for her allbuterol. Patient stated that her husband was told by Dr. Sharlet Salina that a medication refill would be ok. And now she is not able to get her refill. The patient is using inappropriate language while requesting the refill today. Patient was advised that a medication refill appt would need to be scheduled to refill  the medication refill. The medication denied the appt or a virtual appt. Stating that is not how she wanted to spend her money. The patient and the patient'she husband demanded the medication refill today. And stated that she did not want to go to the ER in the midst of a [employer]. Lastly, medication was requested today.

## 2018-12-31 NOTE — Telephone Encounter (Signed)
Due to abusive behavior and language the patient will be dismissed. They were using foul language with call center and have been warned about this in the past. They were in the office and threatening staff and security was called. They did leave the office. Dismissal letter done and will be mailed to the patient.

## 2019-01-02 ENCOUNTER — Telehealth: Payer: Self-pay | Admitting: Internal Medicine

## 2019-01-02 NOTE — Telephone Encounter (Signed)
Pt has been dismissed from all [facility], pt and pt husband are aware as of 12/31/2018. Pt had been warned in previous visit by provider that yelling/cursing at staff is not acceptable behavior. This behavior occurred again 12/31/2018 from pt husband, to the [facility], [place] staff both on the phone and in person. Security was called to escort him from the building.

## 2019-01-04 ENCOUNTER — Telehealth: Payer: Self-pay | Admitting: Internal Medicine

## 2019-01-04 NOTE — Telephone Encounter (Signed)
Patient dismissed from [facility] by Dr Pricilla Holm, effective 12/31/2018. Dismissal letter was sent by registered mail. js

## 2019-01-05 NOTE — Telephone Encounter (Signed)
Spoke with patient's husband.  Patient was told as her husband was being escorted out of the practice he was told they could at least get the inhaler filled. Patient knows she has been dismissed, but she states she only has 2 puffs left of her inhaler and she needs a refill to get her though Covid-19 so she doesn't have to go to the ER.  She is willing to have a Virtual visit to get this medication refilled if Dr. Sharlet Salina or someone in the office can do the visit with her.  If not, she can possibly do a E-visit through [employer]. Please let me know. Patient insists she needs this albuterol.

## 2019-01-06 ENCOUNTER — Telehealth: Payer: Self-pay | Admitting: Internal Medicine

## 2019-01-06 MED ORDER — ALBUTEROL SULFATE 108 (90 BASE) MCG/ACT IN AEPB: 1.0000 | INHALATION_SPRAY | Freq: Four times a day (QID) | RESPIRATORY_TRACT | 0 refills | Status: AC

## 2019-01-06 NOTE — Telephone Encounter (Signed)
Spoke at length with patients husband on 01/05/19 and explained patient's dismissal and that she will need to find a new PCP.  I explained that she can also go to [email or web address] to have an E-visit or an [facility] virtual visit with a provider if she needs that before she can find a PCP.  I was speaking to the patient's husband, and the patient was screaming and cussing in the background throughout the conversation that she was going to make her facebook post about Ray go viral.  She was clearly still very upset with the way things were handled around her Albuterol.  I sent a message to Dr. Sharlet Salina and the medication was refilled for one last time for the patient.  Tried to call patient's husband back and was not able to reach him by phone.  I left a message for the patient that her medication has been sent to the pharmacy.

## 2019-02-17 ENCOUNTER — Other Ambulatory Visit: Payer: Self-pay | Admitting: Internal Medicine

## 2019-02-17 DIAGNOSIS — F3112 Bipolar disorder, current episode manic without psychotic features, moderate: Secondary | ICD-10-CM

## 2019-02-19 ENCOUNTER — Other Ambulatory Visit: Payer: Self-pay | Admitting: Internal Medicine

## 2019-02-19 DIAGNOSIS — F3112 Bipolar disorder, current episode manic without psychotic features, moderate: Secondary | ICD-10-CM

## 2019-03-22 ENCOUNTER — Other Ambulatory Visit: Payer: Self-pay | Admitting: Family Medicine

## 2019-03-22 DIAGNOSIS — Z79899 Other long term (current) drug therapy: Secondary | ICD-10-CM

## 2019-06-09 ENCOUNTER — Other Ambulatory Visit: Payer: Self-pay | Admitting: Internal Medicine

## 2019-06-25 ENCOUNTER — Other Ambulatory Visit: Payer: Self-pay | Admitting: Internal Medicine

## 2019-10-16 ENCOUNTER — Other Ambulatory Visit: Payer: Self-pay | Admitting: Internal Medicine

## 2019-11-02 ENCOUNTER — Telehealth: Payer: Self-pay | Admitting: Cardiology

## 2019-11-02 MED ORDER — LISINOPRIL 10 MG PO TABS: 10.0000 mg | ORAL_TABLET | Freq: Every day | ORAL | 1 refills | Status: DC

## 2019-11-02 NOTE — Addendum Note (Signed)
Addended by: Nuala Alpha on: 11/02/2019 05:39 PM   Modules accepted: Orders

## 2019-11-02 NOTE — Telephone Encounter (Signed)
Please start her on lisinopril 5 mg daily, she will need to be seen within the next 2 weeks by a PA for follow-up of her blood pressure as well as labs since we do not have any for years.

## 2019-11-02 NOTE — Telephone Encounter (Signed)
Pt c/o BP issue: STAT if pt c/o blurred vision, one-sided weakness or slurred speech  1. What are your last 5 BP readings?  138/90, 130/78, 126/78 112/80, 127/83 2. Are you having any other symptoms (ex. Dizziness, headache, blurred vision, passed out)?  Squeezing in her chest, short of breath, dizziness,  3. What is your BP issue?  High blood pressure- these readings are high for her

## 2019-11-02 NOTE — Telephone Encounter (Signed)
Patient states that her blood pressure has been running high over the past few days. It is normally 110s/60s but has been running in the 130s which she is very concerned about. Patient states that she has been taking an extra 2.5 mg of lisinopril to try and bring it down but it has not helped. Patient was very anxious over the phone and short of breath. She states that she has been getting headaches, feels breathless and feels like her heart is being squeezed. She states that she has also had some neck pain and that she is hot. I advised the patient that she needed to go to the emergency room. The patient states that she does not want to go, she is scared of getting COVID. I advised her that taking care of her heart was more important. Patient wanted to be seen so I made her an appointment to see Richardson Dopp, PA-C on Friday. Patient verbalized understanding and stated that if she could not get it under control that she would go to the ER if she had to.

## 2019-11-02 NOTE — Telephone Encounter (Signed)
Spoke with the pt and informed her of Dr. Francesca Oman recommendations. Pt states she is already on lisinopril 5 mg po daily, its just ordered from an outside PCP.  Informed Dr. Meda Coffee of this, and now she wants the pt to take increased dose of lisinopril 10 mg po daily, and instead of seeing an APP in 2 weeks, she should follow-up as scheduled with Richardson Dopp PA-C for this Friday 1/29 at 1145, in the clinic.  Informed the pt that Nicki Reaper will see her, then advise on needed labs thereafter.  Confirmed the pharmacy of choice with the pt.  Pt verbalized understanding and agrees with this plan.

## 2019-11-03 NOTE — Progress Notes (Signed)
Cardiology Office Note:    Date:  11/04/2019   ID:  Kathleen Sullivan, DOB Apr 03, 1976, MRN [id number]  PCP:  Kathleen Rasmussen, MD  Cardiologist:  Kathleen Dawley, MD  Electrophysiologist:  None   Referring MD: No ref. provider found   Chief Complaint:  Follow-up (Blood Pressure)    Patient Profile:    Kathleen Sullivan is a 44 y.o. female with:   Adrenal insufficiency  Panic d/o  GERD   Asthma   Obesity   Chest pain   Palpitations   Mild MR/Mild TR on echocardiogram in 2008 (eval by Dr. Stanford Sullivan)   Prior CV studies:  Echocardiogram 04/06/17 EF 60-65, no RWMA, trivial MR  History of Present Illness:    [name] was last seen in clinic by Kathleen Copa, PA-C in 03/2017 for chest pain.  Echocardiogram was obtained at that time and demonstrated normal EF.    She is followed by Dr. Forde Sullivan for Addison's disease.  Her cortisone was adjusted about a year and a half ago.  Over the last several months, she started having elevated blood pressures.  Her PCP started her on lisinopril 2.5 mg.  Her pressure was doing well until several weeks ago.  She had to increase this to 2.5 mg twice a day and she is now on 5 mg twice a day.  She still has had some spikes in her blood pressure later in the evening and takes an extra 2-1/2 mg.  She knows that her blood pressure is up because she gets a headache and is flushed.  She also gets shortness of breath and some chest discomfort.  When her pressure is down, she feels well without exertional chest pain or shortness of breath.  She has not had syncope, orthopnea or leg swelling.  Of note, she had preeclampsia with the birth of her child.  The highest her pressures has been is 130s over 90s.     Past Medical History:  Diagnosis Date  . Adrenal failure ([place])    takes solucortef daily  . Adrenal insufficiency ([place])   . Allergy, unspecified not elsewhere classified   . Anxiety   . Atypical chest pain   . Condyloma acuminatum   .  Depression   . Esophageal reflux    takes OTC when needed  . Hallux rigidus   . Headache(784.0)   . Mild mitral regurgitation   . Mild tricuspid regurgitation   . Other malignant neoplasm without specification of site    skin cancer  . Panic disorder without agoraphobia   . Personal history of unspecified circulatory disease   . PVC's (premature ventricular contractions)   . Unspecified asthma(493.90)    allergy induced asthma    Current Medications: Current Meds  Medication Sig  . Albuterol Sulfate (PROAIR RESPICLICK) 253 (90 Base) MCG/ACT AEPB Inhale 1 puff into the lungs 4 (four) times daily.  Marland Kitchen ALPRAZolam (XANAX XR) 1 MG 24 hr tablet Take 1 mg by mouth daily at 10 pm.  . BELSOMRA 15 MG TABS TAKE 1 TABLET BY MOUTH EVERYDAY AT BEDTIME  . cholecalciferol (VITAMIN D) 1000 units tablet Take 6,000 Units by mouth daily.  Marland Kitchen CRANBERRY PO Take 1 tablet by mouth daily.  . cyclobenzaprine (FLEXERIL) 5 MG tablet Take 5 mg by mouth as needed.   . fexofenadine (ALLEGRA) 180 MG tablet Take 180 mg by mouth daily.  Marland Kitchen gabapentin (NEURONTIN) 300 MG capsule Take 1,200 mg by mouth daily.   Marland Kitchen HYDROcodone-acetaminophen (NORCO/VICODIN) 5-325 MG  tablet Take 1 tablet by mouth as needed.   . hydrocortisone (CORTEF) 20 MG tablet Take 30 mg by mouth as directed.   . hydrocortisone 2.5 % cream Apply 1 application topically 2 (two) times daily as needed (Use as directed).  . hydrocortisone sodium succinate (SOLU-CORTEF) 100 MG SOLR injection Inject in the muscle 100 mg as needed if you cannot take the hydrocortisone by mouth  . lisinopril (ZESTRIL) 10 MG tablet Take 1 tablet (10 mg total) by mouth daily.  Marland Kitchen Lysine 1000 MG TABS Take 2 tablets by mouth daily.  . montelukast (SINGULAIR) 10 MG tablet TAKE 1 TABLET BY MOUTH EVERYDAY AT BEDTIME  . Multiple Vitamin (MULTIVITAMIN) tablet Take 1 tablet by mouth daily.  . Olopatadine HCl 0.2 % SOLN Apply 2 drops to eye every 4 (four) hours as needed (Use as directed).    . Probiotic Product (PROBIOTIC PO) Take 1 tablet by mouth daily.  . SYRINGE-NEEDLE, DISP, 3 ML (B-D SYRINGE/NEEDLE 3CC/25GX5/8) 25G X 5/8" 3 ML MISC Use as needed  . valACYclovir (VALTREX) 1000 MG tablet TAKE ONE TABLET BY MOUTH TWICE A DAY  . Water For [employer] Parabens (STERILE WATER, BACTERIOSTATIC,) SOLN injection Please dissolve the hydrocortisone solution and inject 100 mg in the muscle as needed     Allergies:   Diflucan [fluconazole], Metoclopramide hcl, and Tuna [fish allergy]   Social History   Tobacco Use  . Smoking status: Former Research scientist (life sciences)  . Smokeless tobacco: Never Used  . Tobacco comment: >15 yrs ago.  Substance Use Topics  . Alcohol use: Yes    Comment: occasional  . Drug use: No     Family Hx: The patient's family history includes Anxiety disorder in her mother; Breast cancer in her mother and another family member; Coronary artery disease in an other family member; Diabetes in her brother and another family member; Hypertension in an other family member; Mental illness in her mother; Stroke in an other family member; Uterine cancer in her mother. There is no history of Colon cancer.  Review of Systems  Constitution:       No loss of taste or smell  Respiratory: Negative for cough.   Gastrointestinal: Negative for diarrhea and vomiting.     EKGs/Labs/Other Test Reviewed:    EKG:  EKG is   ordered today.  The ekg ordered today demonstrates normal sinus rhythm, heart rate 81, normal axis, nonspecific ST-T wave changes, QTC 439, no change from prior tracings  Recent Labs: No results found for requested labs within last 8760 hours.   Recent Lipid Panel Lab Results  Component Value Date/Time   CHOL 154 09/17/2017 08:59 AM   TRIG 164.0 (H) 09/17/2017 08:59 AM   HDL 37.20 (L) 09/17/2017 08:59 AM   CHOLHDL 4 09/17/2017 08:59 AM   LDLCALC 84 09/17/2017 08:59 AM   LDLDIRECT 149.0 08/25/2016 09:55 AM    Physical Exam:    VS:  BP 118/76   Pulse 89   Ht 4'  11" (1.499 m)   Wt 160 lb (72.6 kg)   SpO2 96%   BMI 32.32 kg/m     Wt Readings from Last 3 Encounters:  11/04/19 160 lb (72.6 kg)  12/16/18 145 lb (65.8 kg)  10/27/18 146 lb (66.2 kg)     Constitutional:      Appearance: Healthy appearance. Not in distress.  Neck:     Thyroid: Thyroid normal.     Vascular: JVD normal.  Pulmonary:     Effort: Pulmonary effort  is normal.     Breath sounds: No wheezing. No rales.  Cardiovascular:     Normal rate. Regular rhythm. Normal S1. Normal S2.     Murmurs: There is no murmur.  Abdominal:     Palpations: Abdomen is soft. There is no hepatomegaly.  Skin:    General: Skin is warm and dry.  Neurological:     Mental Status: Alert and oriented to person, place and time.     Cranial Nerves: Cranial nerves are intact.       ASSESSMENT & PLAN:    1. Essential hypertension She notes a history of preeclampsia.  Her pressure started to increase several months ago.  Of note, her cortisone was adjusted by her endocrinologist about a year and a half ago.  Her pressure really does not go extremely high.  One is in the upper 120s or of the 130s over 90s, she becomes symptomatic.  She has a headache, flushed sensation, shortness of breath and chest pain.  She clearly feels better when her pressure is down.  When her pressure is normal, she can exert herself without symptoms.  Although it is unlikely, given her symptoms surrounding elevated blood pressure, I will rule out pheochromocytoma.  I also think she would get better coverage if she takes all her lisinopril once in the morning.  We could add as needed propranolol or even add HCTZ to her lisinopril if her pressure remains elevated.  Since she has had chest discomfort or shortness of breath and a prior history of valvular issues, I have also suggested that we repeat her echocardiogram.   -Obtain c-Met, CBC, TSH  -24-hour urine for catecholamines, metanephrines  -2D echocardiogram  -Take lisinopril 10  mg every morning  -Consider adding propranolol 10 mg as needed for blood pressure spikes  -Consider adding HCTZ to lisinopril if blood pressure is uncontrolled  2. Shortness of breath 3. Other chest pain This all seems to relate to elevated blood pressures.  Her ECG is unchanged.  She does have a prior history of some mitral and tricuspid valvular issues.  Her last echo 2018 demonstrated trivial mitral regurgitation with normal EF.  She is concerned that there may have been a change.  Obtain follow-up echocardiogram.  4. Vitamin D deficiency She would like her vitamin D rechecked.  This will be obtained and sent to her PCP and endocrinologist both manage her vitamin D.  5. Addison's disease She remains on Cortisone.  Follow up with Endocrinology as planned.    Dispo:  Return in about 4 weeks (around 12/02/2019) for Follow up after testing w/ Dr .Meda Coffee, or PA/NP, (virtual or in-person).   Medication Adjustments/Labs and Tests Ordered: Current medicines are reviewed at length with the patient today.  Concerns regarding medicines are outlined above.  Tests Ordered: Orders Placed This Encounter  Procedures  . Comprehensive metabolic panel  . CBC  . TSH  . Vitamin D (25 hydroxy)  . Catecholamines, fractionated, Urine, 24 hour  . Metanephrines, Urine, 24 hour  . EKG 12-Lead  . ECHOCARDIOGRAM COMPLETE   Medication Changes: No orders of the defined types were placed in this encounter.   Signed, Richardson Dopp, PA-C  11/04/2019 1:34 PM    [facility] [address] [place], [place], [place]  [address] Phone: [phone]; Fax: [phone]

## 2019-11-04 ENCOUNTER — Other Ambulatory Visit: Payer: Self-pay

## 2019-11-04 ENCOUNTER — Ambulatory Visit: Payer: BLUE CROSS/BLUE SHIELD | Admitting: Physician Assistant

## 2019-11-04 ENCOUNTER — Encounter: Payer: Self-pay | Admitting: Physician Assistant

## 2019-11-04 VITALS — BP 118/76 | HR 89 | Ht 59.0 in | Wt 160.0 lb

## 2019-11-04 DIAGNOSIS — R0602 Shortness of breath: Secondary | ICD-10-CM | POA: Diagnosis not present

## 2019-11-04 DIAGNOSIS — E271 Primary adrenocortical insufficiency: Secondary | ICD-10-CM

## 2019-11-04 DIAGNOSIS — E559 Vitamin D deficiency, unspecified: Secondary | ICD-10-CM

## 2019-11-04 DIAGNOSIS — R0789 Other chest pain: Secondary | ICD-10-CM

## 2019-11-04 DIAGNOSIS — I10 Essential (primary) hypertension: Secondary | ICD-10-CM | POA: Diagnosis not present

## 2019-11-04 NOTE — Patient Instructions (Signed)
Medication Instructions:   Your physician recommends that you continue on your current medications as directed. Please refer to the Current Medication list given to you today: Take your Lisinopril 10 mg, 1 tablet by mouth in the morning.  *If you need a refill on your cardiac medications before your next appointment, please call your pharmacy*  Lab Work:  You will have labs drawn today: CMET, CBC, TSH, VITAMIN D, AND 24 hour urine  If you have labs (blood work) drawn today and your tests are completely normal, you will receive your results only by: Marland Kitchen MyChart Message (if you have MyChart) OR . A paper copy in the mail If you have any lab test that is abnormal or we need to change your treatment, we will call you to review the results.  Testing/Procedures:  Your physician has requested that you have an echocardiogram. Echocardiography is a painless test that uses sound waves to create images of your heart. It provides your doctor with information about the size and shape of your heart and how well your heart's chambers and valves are working. This procedure takes approximately one hour. There are no restrictions for this procedure.  Follow-Up: At [facility], you and your health needs are our priority.  As part of our continuing mission to provide you with exceptional heart care, we have created designated Provider Care Teams.  These Care Teams include your primary Cardiologist (physician) and Advanced Practice Providers (APPs -  Physician Assistants and Nurse Practitioners) who all work together to provide you with the care you need, when you need it.  Your next appointment:   4 week(s)  The format for your next appointment:   In Person  Provider:   You may see Ena Dawley, MD or one of the following Advanced Practice Providers on your designated Care Team:    Melina Copa, PA-C  Ermalinda Barrios, PA-C   Other Instructions  Call our office if your blood pressure continues to  spike after taking your Lisinopril in the morning.

## 2019-11-05 LAB — COMPREHENSIVE METABOLIC PANEL
ALT: 13 IU/L (ref 0–32)
AST: 16 IU/L (ref 0–40)
Albumin/Globulin Ratio: 1.7 (ref 1.2–2.2)
Albumin: 4.7 g/dL (ref 3.8–4.8)
Alkaline Phosphatase: 72 IU/L (ref 39–117)
BUN/Creatinine Ratio: 10 (ref 9–23)
BUN: 8 mg/dL (ref 6–24)
Bilirubin Total: 0.4 mg/dL (ref 0.0–1.2)
CO2: 23 mmol/L (ref 20–29)
Calcium: 9.3 mg/dL (ref 8.7–10.2)
Chloride: 103 mmol/L (ref 96–106)
Creatinine, Ser: 0.84 mg/dL (ref 0.57–1.00)
GFR calc Af Amer: 98 mL/min/{1.73_m2} (ref >59)
GFR calc non Af Amer: 85 mL/min/{1.73_m2} (ref >59)
Globulin, Total: 2.7 g/dL (ref 1.5–4.5)
Glucose: 89 mg/dL (ref 65–99)
Potassium: 4.2 mmol/L (ref 3.5–5.2)
Sodium: 140 mmol/L (ref 134–144)
Total Protein: 7.4 g/dL (ref 6.0–8.5)

## 2019-11-05 LAB — CBC
Hematocrit: 48 % — ABNORMAL HIGH (ref 34.0–46.6)
Hemoglobin: 17.1 g/dL — ABNORMAL HIGH (ref 11.1–15.9)
MCH: 34.5 pg — ABNORMAL HIGH (ref 26.6–33.0)
MCHC: 35.6 g/dL (ref 31.5–35.7)
MCV: 97 fL (ref 79–97)
Platelets: 340 10*3/uL (ref 150–450)
RBC: 4.96 x10E6/uL (ref 3.77–5.28)
RDW: 13.5 % (ref 11.7–15.4)
WBC: 13.4 10*3/uL — ABNORMAL HIGH (ref 3.4–10.8)

## 2019-11-05 LAB — TSH: TSH: 1.7 u[IU]/mL (ref 0.450–4.500)

## 2019-11-05 LAB — VITAMIN D 25 HYDROXY (VIT D DEFICIENCY, FRACTURES): Vit D, 25-Hydroxy: 58.4 ng/mL (ref 30.0–100.0)

## 2019-11-07 ENCOUNTER — Telehealth: Payer: Self-pay | Admitting: Physician Assistant

## 2019-11-07 DIAGNOSIS — I10 Essential (primary) hypertension: Secondary | ICD-10-CM

## 2019-11-07 DIAGNOSIS — R0602 Shortness of breath: Secondary | ICD-10-CM

## 2019-11-07 DIAGNOSIS — R002 Palpitations: Secondary | ICD-10-CM

## 2019-11-07 NOTE — Telephone Encounter (Signed)
New Message  Pt c/o medication issue:  1. Name of Medication: lisinopril (ZESTRIL) 10 MG tablet  2. How are you currently taking this medication (dosage and times per day)? Take 1 tablet (10 mg total) by mouth daily.  3. Are you having a reaction (difficulty breathing--STAT)? No  4. What is your medication issue? Patient states that she was told to try the lisinopril by Richardson Dopp PA and patient states that medication wears off after 6-8 hours and after taking medication she noticed a spike in blood pressure. Please give patient a call back to advise.

## 2019-11-07 NOTE — Telephone Encounter (Signed)
I spoke with patient. She reports her time schedule is different than most people and she wanted [place] to know her 24 hour urine went from 2 PM-2 AM.  She goes to bed at 5 AM and gets up at 2 PM. At last office visit she was instructed to take lisinopril  once daily.  Her husband wakes her up at 11 AM and she takes lisinopril at this time.  She started doing this yesterday. Yesterday she could tell her BP was elevated because she was feeling short of breath and having palpitations. She reports the following readings - 1/31-2/1  2.30 PM- 116/79 6:45 PM-111/82,79 9:16 PM-124/86 10 PM -129/84, 75 3 AM-114/82,93--she took 2.5 mg lisinopril at this time 5 AM-114/84,86--took additional 2.5 mg lisinopril.  5:30 AM-100/68.  She reports her monitor also show arrhythmia which she thinks is PAC's and PVC's. Patient reports she does not think the daily dose of lisinopril is working.   Patient reports she has viewed lab results and comments from Richardson Dopp, [place] in my chart.  She will contact PCP as recommended

## 2019-11-07 NOTE — Telephone Encounter (Signed)
Her BP is really not that high.  It may be that she is experiencing palpitations that is causing her BP to increase some.  I think we should try a short acting beta-blocker to see if this helps with those symptoms. I am not sure I understand her 24 hour urine schedule.  The time listed is for 12 hours. PLAN:  If she starts collecting urine at 2 pm, she should stop collecting at 2 pm the next day. Continue Lisinopril 10 mg once daily  Start Propranolol 10 mg once daily as needed for palpitations/BP spike Let's get a 14 day Zio XT monitor (Dx: palpitations). Richardson Dopp, PA-C    11/07/2019 8:39 PM

## 2019-11-08 ENCOUNTER — Telehealth: Payer: Self-pay | Admitting: Cardiology

## 2019-11-08 NOTE — Telephone Encounter (Signed)
New Message  Pt is returning phone call. Please call back

## 2019-11-08 NOTE — Telephone Encounter (Signed)
LMTCB

## 2019-11-09 ENCOUNTER — Telehealth: Payer: Self-pay | Admitting: Radiology

## 2019-11-09 MED ORDER — PROPRANOLOL HCL 10 MG PO TABS: 10.0000 mg | ORAL_TABLET | ORAL | 3 refills | Status: DC | PRN

## 2019-11-09 NOTE — Telephone Encounter (Signed)
Spoke with Amsi the pts husband on DPR and he verbalized understanding of [employer] PA recommendations... they agreed to try the PRN Propranolol and to the 14 day Zio patch.   Pt did her 24 hour urine and recently returned it to the lab.  Pt to keep Echo appt 11/15/19.

## 2019-11-09 NOTE — Telephone Encounter (Signed)
Enrolled patient for a 14 day Zio monitor to be mailed to patients home.

## 2019-11-09 NOTE — Telephone Encounter (Signed)
LMTCB

## 2019-11-15 ENCOUNTER — Ambulatory Visit (HOSPITAL_COMMUNITY): Payer: BC Managed Care – PPO | Attending: Cardiology

## 2019-11-15 ENCOUNTER — Other Ambulatory Visit: Payer: Self-pay

## 2019-11-15 DIAGNOSIS — R0602 Shortness of breath: Secondary | ICD-10-CM | POA: Diagnosis present

## 2019-11-15 DIAGNOSIS — R0789 Other chest pain: Secondary | ICD-10-CM

## 2019-11-15 LAB — METANEPHRINES, URINE, 24 HOUR
Metaneph Total, Ur: 36 ug/L
Metanephrines, 24H Ur: 36 ug/24 hr (ref 36–209)
Normetanephrine, 24H Ur: 150 ug/24 hr (ref 131–612)
Normetanephrine, Ur: 149 ug/L

## 2019-11-15 LAB — CATECHOLAMINES, FRACTIONATED, URINE, 24 HOUR
Dopamine , 24H Ur: 109 ug/24 hr (ref 0–510)
Dopamine, Rand Ur: 109 ug/L
Epinephrine, 24H Ur: 4 ug/24 hr (ref 0–20)
Epinephrine, Rand Ur: 4 ug/L
Norepinephrine, 24H Ur: 23 ug/24 hr (ref 0–135)
Norepinephrine, Rand Ur: 23 ug/L

## 2019-11-16 ENCOUNTER — Encounter: Payer: Self-pay | Admitting: Physician Assistant

## 2019-11-23 ENCOUNTER — Other Ambulatory Visit (INDEPENDENT_AMBULATORY_CARE_PROVIDER_SITE_OTHER): Payer: BC Managed Care – PPO

## 2019-11-23 DIAGNOSIS — R002 Palpitations: Secondary | ICD-10-CM | POA: Diagnosis not present

## 2019-11-23 DIAGNOSIS — R0602 Shortness of breath: Secondary | ICD-10-CM

## 2019-11-23 DIAGNOSIS — I10 Essential (primary) hypertension: Secondary | ICD-10-CM

## 2019-11-27 ENCOUNTER — Other Ambulatory Visit: Payer: Self-pay | Admitting: Cardiology

## 2019-12-05 ENCOUNTER — Encounter: Payer: Self-pay | Admitting: Physician Assistant

## 2019-12-05 NOTE — Progress Notes (Deleted)
Cardiology Office Note    Date:  12/05/2019   ID:  Kathleen Sullivan, DOB 07/03/1976, MRN [id number]  PCP:  Kathleen Rasmussen, Sullivan  Cardiologist:  Kathleen Dawley, Sullivan  Electrophysiologist:  None   Chief Complaint: f/u blood pressure  History of Present Illness:   Kathleen Sullivan is a 44 y.o. female with history of panic disorder, adrenal insufficiency (followed by endocrinology), GERD, asthma, obesity, atypical chest pain who presents for follow-up of blood pressure.  She was remotely followed by Dr. Stanford Breed then Dr. Meda Coffee. She has history of mitral and tricuspid insufficiency and palpitations. She had echocardiogram performed in 2008 that showed normal biventricular size and function and only mild mitral and tricuspid regurgitation. Her palpitations correlated iwth frequent PVCs. She declined BB therapy. Prior CP felt due to reflux and neuropathic pain. I met her in 2018 for atypical chest pain and echo was essentially normal. She recently saw Kathleen Dopp PA-C for blood pressure elevation. Over the last several months, she started having elevated blood pressures. Her PCP started her on lisinopril which required uptitration. Her blood pressures did not go extremely high but she was sensitive to the fluctuations, reporting symptoms of headache, flushing, shortness of breath and chest discomfort when upper 120s-130s over 90s. Repeat echo 11/15/19 showed EF 60-65%, trivial MR, essentially normal. Kathleen Sullivan ordered labs which excluded pheochromocytoma. These also showed normal TSH, Hgb of 17.1 with WBC 13.4, normal CMET with K 4.2; last lipids in 2018 showed LDL 84 (followed by primary care) He consolidated lisinopril to 17m once daily.   elevated wbc hgb anx 5hiaa? Flushing?diarrhea?   Essential HTN History of atypical chest pain/SOB Abnormal CBC Adrenal insufficiency   Past Medical History:  Diagnosis Date  . Adrenal insufficiency ([place]   . Anxiety   . Atypical chest pain   .  Condyloma acuminatum   . Depression   . Esophageal reflux    takes OTC when needed  . Hallux rigidus   . Headache(784.0)   . Mild mitral regurgitation    Echocardiogram 11/2019: EF 60-65, no RWMA, GLS -23.3, normal RVSF, trivial MR  . Mild tricuspid regurgitation   . Other malignant neoplasm without specification of site    skin cancer  . Panic disorder without agoraphobia   . PVC's (premature ventricular contractions)   . Unspecified asthma(493.90)    allergy induced asthma    Past Surgical History:  Procedure Laterality Date  . CESAREAN SECTION    . ESSURE TUBAL LIGATION    . FINGER SURGERY    . NASAL SEPTOPLASTY W/ TURBINOPLASTY N/A 06/07/2015   Procedure: NASAL SEPTOPLASTY WITH TURBINATE REDUCTION ;  Surgeon: Kathleen Sullivan;  [facility]  Service: ENT;  Laterality: N/A;    Current Medications: No outpatient medications have been marked as taking for the 12/06/19 encounter (Appointment) with Kathleen Pitter PA-C.   ***   Allergies:   Diflucan [fluconazole], Metoclopramide hcl, and TBlain Paisallergy]   Social History   Socioeconomic History  . Marital status: Married    Spouse name: Not on file  . Number of children: 1  . Years of education: 163 . Highest education level: Not on file  Occupational History  . 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 Smoking status: Former SResearch scientist (life sciences) . Smokeless tobacco: Never Used  . Tobacco comment: >15 yrs ago.  Substance and Sexual Activity  . Alcohol use: Yes  Comment: occasional  . Drug use: No  . Sexual activity: Yes    Partners: Male    Comment: has had Essure  Other Topics Concern  . 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 Married '01, 1 dtr - '05. 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 Difficulty of Paying Living Expenses: Not on file   Food Insecurity:   . Worried About Charity fundraiser in the Last Year: Not on file  . Ran Out of Food in the Last Year: Not on file  Transportation Needs:   . Lack of Transportation (Medical): Not on file  . Lack of Transportation (Non-Medical): Not on file  Physical Activity:   . Days of Exercise per Week: Not on file  . Minutes of Exercise per Session: Not on file  Stress:   . Feeling of Stress : Not on file  Social Connections:   . Frequency of Communication with Friends and Family: Not on file  . Frequency of Social Gatherings with Friends and Family: Not on file  . Attends Religious Services: Not on file  . Active Member of Clubs or Organizations: Not on file  . Attends Archivist Meetings: Not on file  . Marital Status: Not on file     Family History:  The patient's ***family history includes Anxiety disorder in her mother; Breast cancer in her mother and another family member; Coronary artery disease in an other family member; Diabetes in her brother and another family member; Hypertension in an other family member; Mental illness in her mother; Stroke in an other family member; Uterine cancer in her mother. There is no history of Colon cancer.  ROS:   Please see the history of present illness. Otherwise, review of systems is positive for ***.  All other systems are reviewed and otherwise negative.    EKGs/Labs/Other Studies Reviewed:    Studies reviewed are outlined and summarized above. Reports included below if pertinent.  2D echo 11/15/19 1. Left ventricular ejection fraction, by estimation, is 60 to 65%. The  left ventricle has normal function. The left ventrical has no regional  wall motion abnormalities. Left ventricular diastolic parameters were  normal. The average left ventricular  global longitudinal strain is -23.3 %.  2. Right ventricular systolic function is normal. The right ventricular  size is normal.  3. Trivial mitral valve  regurgitation.  4. The aortic valve is normal in structure and function. Aortic valve  regurgitation is not visualized. No aortic stenosis is present.  5. The inferior vena cava is normal in size with greater than 50%  respiratory variability, suggesting right atrial pressure of 3 mmHg.   Comparison(s): No significant change from prior study. 04/06/17 EF 60-65%.     EKG:  EKG is ordered today, personally reviewed, demonstrating ***  Recent Labs: 11/04/2019: ALT 13; BUN 8; Creatinine, Ser 0.84; Hemoglobin 17.1; Platelets 340; Potassium 4.2; Sodium 140; TSH 1.700  Recent Lipid Panel    Component Value Date/Time   CHOL 154 09/17/2017 0859   TRIG 164.0 (H) 09/17/2017 0859   HDL 37.20 (L) 09/17/2017 0859   CHOLHDL 4 09/17/2017 0859   VLDL 32.8 09/17/2017 0859   LDLCALC 84 09/17/2017 0859   LDLDIRECT 149.0 08/25/2016 0955    PHYSICAL EXAM:    VS:  There were no vitals taken for this visit.  BMI: There is no  height or weight on file to calculate BMI.  GEN: Well nourished, well developed, in no acute distress HEENT: normocephalic, atraumatic Neck: no JVD, carotid bruits, or masses Cardiac: ***RRR; no murmurs, rubs, or gallops, no edema  Respiratory:  clear to auscultation bilaterally, normal work of breathing GI: soft, nontender, nondistended, + BS MS: no deformity or atrophy Skin: warm and dry, no rash Neuro:  Alert and Oriented x 3, Strength and sensation are intact, follows commands Psych: euthymic mood, full affect  Wt Readings from Last 3 Encounters:  11/04/19 160 lb (72.6 kg)  12/16/18 145 lb (65.8 kg)  10/27/18 146 lb (66.2 kg)     ASSESSMENT & PLAN:   1. ***  Disposition: F/u with ***   Medication Adjustments/Labs and Tests Ordered: Current medicines are reviewed at length with the patient today.  Concerns regarding medicines are outlined above. Medication changes, Labs and Tests ordered today are summarized above and listed in the Patient Instructions accessible  in Encounters.   Signed, Charlie Pitter, PA-C  12/05/2019 10:24 AM    [facility] [place], [place], [place]  [address] Phone: [phone]; Fax: [phone]

## 2019-12-06 ENCOUNTER — Telehealth: Payer: Self-pay | Admitting: Cardiology

## 2019-12-06 ENCOUNTER — Ambulatory Visit: Payer: BC Managed Care – PPO | Admitting: Physician Assistant

## 2019-12-06 NOTE — Telephone Encounter (Signed)
New Message    Pts husband is calling and is wondering if the results for the monitor can be given over the phone     Please call pt with results

## 2019-12-06 NOTE — Telephone Encounter (Signed)
I called and left patient a message to call back. What questions did patient have about her monitor results?

## 2019-12-12 NOTE — Telephone Encounter (Signed)
Patient returned call, she would like to be called with monitor results and when she is called she would like to schedule follow up at that time. Patient had to cancel follow up.

## 2019-12-12 NOTE — Telephone Encounter (Signed)
I called and left patient a message to call back.

## 2019-12-15 ENCOUNTER — Encounter: Payer: Self-pay | Admitting: Physician Assistant

## 2019-12-23 ENCOUNTER — Telehealth: Payer: Self-pay

## 2019-12-23 NOTE — Telephone Encounter (Signed)
Her blood pressure readings have been 111/82, 131/97, 101/73 she states this was 5 hours after the 131/97, 119/85, 115/83, 121/91, 126/88, 124/87 111/85, 117/90, 131/92, 112/85, 109/76, 114/83, 119/87, 121/89, 124/93, 125/93. Patient is concerned with beta blockers that the top number will be too low. The bottom number stays up but the top number will go down. She is worried about the bottom number staying elevated and the top number going down too much.

## 2019-12-23 NOTE — Telephone Encounter (Signed)
I called and spoke with patient, she is aware of heart rate monitor results. Patient states that the Lisinopril 10 mg is not working, her blood pressure is elevated. She states that she is splitting it in half to get better coverage and she is also taking the propranolol once a day with splitting this also. 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 She tries to manage her stress, she does not drink caffeine, and she meditates to help with her stress level. She feels like her blood pressure is out of control and this is stressing her out.

## 2019-12-26 NOTE — Telephone Encounter (Signed)
We can reassure her that (according to the numbers provided) her BP, although elevated, is not critically elevated.  These are readings that should be managed but I would not recommend taking extra medication based upon 1 reading.  Checking BP several times in a row, especially when concerned about her BP, will continue to make it go up.  I recommend that BP be checked once daily and recorded.  It is ok to check BP at other times if she is feeling bad.  If BP is elevated on 1 reading, wait 30-45 minutes before checking it again.  Make sure next reading is taken after sitting for 15 minutes.  No caffeine or smoking (I believe she has quit smoking though) 20-30 minutes before BP is taken.  Feet should be flat on the floor, not crossed and the arm should be at heart level.  The propranolol is prescribed to be taken as needed for palpitations. Lisinopril is prescribed to be taken 10 mg once daily. It looks like her diastolic is the number that is elevated at times. PLAN:  Continue taking Propranolol as needed for palpitations only. Continue taking Lisinopril 10 mg once daily only. Make sure she is maintaining a low salt diet.  Increased dietary sodium can cause elevated diastolic readings.  Diet should be 2 grams sodium or less.   We can try a mild diuretic as these often help the diastolic reading and work really well with Lisinopril.  I would rather start low and increase only if needed.  I do not want her increasing it on her own.  She should call if readings are elevated (See below). Start HCTZ 12.5 mg every Mon, Wed, Fri (only).  Take in AM. Check BP ONCE daily for 2 weeks. Schedule HTN clinic appt in 2 weeks.  Bring BP readings to appt. Arrange BMET in 2 weeks.  Have patient schedule follow up with endocrinologist.  I wonder if her cortisone for Addison's is contributing any to this. Call if systolic reading is > 283 or diastolic reading is >/= 151 Mylinda Brook, PA-C    12/26/2019 8:17 AM

## 2019-12-27 NOTE — Telephone Encounter (Signed)
Follow up  Pt returning call for her recommendation  Please call

## 2019-12-27 NOTE — Telephone Encounter (Signed)
I called and left patient a message to call back.

## 2019-12-28 NOTE — Telephone Encounter (Signed)
I called and left patient a detailed message with Scott's instructions. Advised patient to call back to get follow up appointments scheduled.

## 2019-12-29 NOTE — Telephone Encounter (Signed)
Patient's husband called wanting some clarification on your last message. Would like a call back.

## 2019-12-29 NOTE — Telephone Encounter (Signed)
I placed call to patient's husband and left message to call office

## 2019-12-29 NOTE — Telephone Encounter (Signed)
lp's husband mtcb 3/25

## 2019-12-30 NOTE — Telephone Encounter (Signed)
Amsi the patient's husband is returning phone call.

## 2020-01-03 NOTE — Telephone Encounter (Signed)
Left message to call back  

## 2020-01-05 MED ORDER — HYDROCHLOROTHIAZIDE 12.5 MG PO CAPS: ORAL_CAPSULE | ORAL | 3 refills | Status: DC

## 2020-01-05 NOTE — Telephone Encounter (Signed)
Left detailed message for Pt.  Advised to see her mychart messages for direction regarding her blood pressure.  Briefly went over instructions.  Advised she would receive a call to schedule a follow up with HTN clinic in 2 weeks and would get lab work at that time.  Advised to respond via MyChart with any questions or call office.  Will forward to [facility] pool for follow up appt.

## 2020-01-09 NOTE — Telephone Encounter (Signed)
Called patient and left VM for her to call back. Calling to schedule appointment in HTN clinic

## 2020-01-11 NOTE — Telephone Encounter (Signed)
Left message again.

## 2020-01-12 NOTE — Telephone Encounter (Addendum)
Called Kathleen Sullivan to schedule follow up HTN visit. Kathleen Sullivan stated she has concerns that she is not being taken care of, listened to, or taken seriously. States she was told not to eat any salt but she has Addison's and needs to. Kathleen Sullivan mentioned concerns for about 10 minutes on the phone. Does report she is taking her HCTZ on MWF as instructed at her last visit. She is seeing her PCP at 6pm today for BP management. I asked if she would like her PCP to manage her BP instead. She states she would like to see Dr Meda Coffee and prefers female providers. She has not seen Dr Meda Coffee since 2015 - will forward to scheduling to reach out and have Kathleen Sullivan reestablished as a new patient.

## 2020-01-12 NOTE — Telephone Encounter (Signed)
Ok.  Thank you. Schedule with Dr. Meda Coffee. Richardson Dopp, PA-C    01/12/2020 10:08 PM

## 2020-02-01 ENCOUNTER — Other Ambulatory Visit: Payer: Self-pay | Admitting: Physician Assistant

## 2020-12-01 ENCOUNTER — Other Ambulatory Visit: Payer: Self-pay | Admitting: Physician Assistant

## 2020-12-30 ENCOUNTER — Other Ambulatory Visit: Payer: Self-pay | Admitting: Cardiology

## 2021-02-09 ENCOUNTER — Other Ambulatory Visit: Payer: Self-pay | Admitting: Cardiology

## 2021-10-09 LAB — EXTERNAL GENERIC LAB PROCEDURE: COLOGUARD: NEGATIVE
